# Patient Record
Sex: Female | Born: 1995 | Race: White | Hispanic: No | Marital: Single | Smoking: Former smoker
Health system: Southern US, Community
[De-identification: ages and names within clinical notes are randomized; demographics above are authoritative.]

## PROBLEM LIST (undated history)

## (undated) ENCOUNTER — Inpatient Hospital Stay: Payer: Self-pay

## (undated) DIAGNOSIS — K35209 Acute appendicitis with generalized peritonitis, without abscess, unspecified as to perforation: Secondary | ICD-10-CM

## (undated) DIAGNOSIS — F419 Anxiety disorder, unspecified: Secondary | ICD-10-CM

## (undated) DIAGNOSIS — K37 Unspecified appendicitis: Secondary | ICD-10-CM

## (undated) DIAGNOSIS — E162 Hypoglycemia, unspecified: Secondary | ICD-10-CM

## (undated) DIAGNOSIS — Q513 Bicornate uterus: Secondary | ICD-10-CM

## (undated) DIAGNOSIS — K219 Gastro-esophageal reflux disease without esophagitis: Secondary | ICD-10-CM

## (undated) DIAGNOSIS — K352 Acute appendicitis with generalized peritonitis, without abscess: Secondary | ICD-10-CM

## (undated) HISTORY — DX: Acute appendicitis with generalized peritonitis, without abscess, unspecified as to perforation: K35.209

## (undated) HISTORY — PX: APPENDECTOMY: SHX54

## (undated) HISTORY — DX: Acute appendicitis with generalized peritonitis, without abscess: K35.20

## (undated) HISTORY — DX: Unspecified appendicitis: K37

---
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
## 2016-07-06 ENCOUNTER — Emergency Department
Admission: EM | Admit: 2016-07-06 | Discharge: 2016-07-06 | Payer: No Typology Code available for payment source | Attending: Emergency Medicine | Admitting: Emergency Medicine

## 2016-07-06 ENCOUNTER — Encounter: Payer: Self-pay | Admitting: Emergency Medicine

## 2016-07-06 DIAGNOSIS — Y998 Other external cause status: Secondary | ICD-10-CM | POA: Insufficient documentation

## 2016-07-06 DIAGNOSIS — Y92410 Unspecified street and highway as the place of occurrence of the external cause: Secondary | ICD-10-CM | POA: Insufficient documentation

## 2016-07-06 DIAGNOSIS — Z008 Encounter for other general examination: Secondary | ICD-10-CM | POA: Insufficient documentation

## 2016-07-06 DIAGNOSIS — T07XXXA Unspecified multiple injuries, initial encounter: Secondary | ICD-10-CM

## 2016-07-06 DIAGNOSIS — Y9389 Activity, other specified: Secondary | ICD-10-CM | POA: Insufficient documentation

## 2016-07-06 DIAGNOSIS — T148XXA Other injury of unspecified body region, initial encounter: Secondary | ICD-10-CM | POA: Diagnosis present
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
## 2016-07-06 NOTE — Discharge Instructions (Addendum)
Watch scratches for any signs of infection. Clean daily with mild soap and water. Take Tylenol or ibuprofen as needed for pain. Follow-up with your primary care doctor if any continued problems.
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
## 2016-07-06 NOTE — ED Notes (Signed)
Pt alert and oriented X4, active, cooperative, pt in NAD. RR even and unlabored, color WNL.  Pt left with [employer] Officer, [place].

## 2016-09-04 ENCOUNTER — Emergency Department: Payer: Medicaid Other | Admitting: Registered Nurse

## 2016-09-04 ENCOUNTER — Emergency Department: Payer: Medicaid Other

## 2016-09-04 ENCOUNTER — Encounter: Payer: Self-pay | Admitting: Emergency Medicine

## 2016-09-04 ENCOUNTER — Encounter
Admission: EM | Disposition: A | Discharge status: Home / Self Care | Payer: Self-pay | Source: Home / Self Care | Attending: Emergency Medicine

## 2016-09-04 ENCOUNTER — Observation Stay
Admission: EM | Admit: 2016-09-04 | Discharge: 2016-09-05 | Disposition: A | Discharge status: Home / Self Care | Payer: Medicaid Other | Attending: General Surgery | Admitting: General Surgery

## 2016-09-04 DIAGNOSIS — K352 Acute appendicitis with generalized peritonitis, without abscess: Secondary | ICD-10-CM

## 2016-09-04 DIAGNOSIS — F17210 Nicotine dependence, cigarettes, uncomplicated: Secondary | ICD-10-CM | POA: Insufficient documentation

## 2016-09-04 DIAGNOSIS — K37 Unspecified appendicitis: Secondary | ICD-10-CM | POA: Diagnosis present

## 2016-09-04 HISTORY — PX: LAPAROSCOPIC APPENDECTOMY: SHX408

## 2016-09-04 HISTORY — DX: Unspecified appendicitis: K37

## 2016-09-04 LAB — URINE DRUG SCREEN, QUALITATIVE (ARMC ONLY)
AMPHETAMINES, UR SCREEN: NOT DETECTED
Barbiturates, Ur Screen: NOT DETECTED
Benzodiazepine, Ur Scrn: NOT DETECTED
COCAINE METABOLITE, UR ~~LOC~~: NOT DETECTED
Cannabinoid 50 Ng, Ur ~~LOC~~: POSITIVE — AB
MDMA (ECSTASY) UR SCREEN: NOT DETECTED
METHADONE SCREEN, URINE: NOT DETECTED
OPIATE, UR SCREEN: NOT DETECTED
Phencyclidine (PCP) Ur S: NOT DETECTED
Tricyclic, Ur Screen: NOT DETECTED

## 2016-09-04 LAB — COMPREHENSIVE METABOLIC PANEL
ALBUMIN: 4.9 g/dL (ref 3.5–5.0)
ALT: 29 U/L (ref 14–54)
AST: 27 U/L (ref 15–41)
Alkaline Phosphatase: 54 U/L (ref 38–126)
Anion gap: 11 (ref 5–15)
BUN: 14 mg/dL (ref 6–20)
CHLORIDE: 103 mmol/L (ref 101–111)
CO2: 24 mmol/L (ref 22–32)
Calcium: 9.6 mg/dL (ref 8.9–10.3)
Creatinine, Ser: 0.66 mg/dL (ref 0.44–1.00)
GFR calc Af Amer: >60 mL/min (ref >60)
GFR calc non Af Amer: >60 mL/min (ref >60)
GLUCOSE: 136 mg/dL — AB (ref 65–99)
POTASSIUM: 3.5 mmol/L (ref 3.5–5.1)
SODIUM: 138 mmol/L (ref 135–145)
Total Bilirubin: 0.9 mg/dL (ref 0.3–1.2)
Total Protein: 7.9 g/dL (ref 6.5–8.1)

## 2016-09-04 LAB — URINALYSIS, COMPLETE (UACMP) WITH MICROSCOPIC
BACTERIA UA: NONE SEEN
Bilirubin Urine: NEGATIVE
Glucose, UA: NEGATIVE mg/dL
KETONES UR: 20 mg/dL — AB
LEUKOCYTES UA: NEGATIVE
Nitrite: NEGATIVE
PROTEIN: 30 mg/dL — AB
RBC / HPF: NONE SEEN RBC/hpf (ref 0–5)
Specific Gravity, Urine: 1.023 (ref 1.005–1.030)
WBC UA: NONE SEEN WBC/hpf (ref 0–5)
pH: 8 (ref 5.0–8.0)

## 2016-09-04 LAB — CBC
HEMATOCRIT: 41 % (ref 35.0–47.0)
HEMOGLOBIN: 14.3 g/dL (ref 12.0–16.0)
MCH: 29 pg (ref 26.0–34.0)
MCHC: 34.9 g/dL (ref 32.0–36.0)
MCV: 83.1 fL (ref 80.0–100.0)
Platelets: 346 10*3/uL (ref 150–440)
RBC: 4.93 MIL/uL (ref 3.80–5.20)
RDW: 13 % (ref 11.5–14.5)
WBC: 16.2 10*3/uL — AB (ref 3.6–11.0)

## 2016-09-04 LAB — LIPASE, BLOOD: LIPASE: 28 U/L (ref 11–51)

## 2016-09-04 LAB — POCT PREGNANCY, URINE: PREG TEST UR: NEGATIVE

## 2016-09-04 SURGERY — APPENDECTOMY, LAPAROSCOPIC
Anesthesia: General | Wound class: Clean Contaminated

## 2016-09-04 MED ORDER — FENTANYL CITRATE (PF) 100 MCG/2ML IJ SOLN
INTRAMUSCULAR | Status: DC | PRN
  Administered 2016-09-04: 150 ug via INTRAVENOUS
  Administered 2016-09-04: 50 ug via INTRAVENOUS

## 2016-09-04 MED ORDER — SUGAMMADEX SODIUM 200 MG/2ML IV SOLN
INTRAVENOUS | Status: DC | PRN
  Administered 2016-09-04: 110 mg via INTRAVENOUS

## 2016-09-04 MED ORDER — BUPIVACAINE-EPINEPHRINE (PF) 0.25% -1:200000 IJ SOLN
INTRAMUSCULAR | Status: DC | PRN
  Administered 2016-09-04: 10 mL

## 2016-09-04 MED ORDER — SUGAMMADEX SODIUM 200 MG/2ML IV SOLN
INTRAVENOUS | Status: AC
  Filled 2016-09-04: qty 2

## 2016-09-04 MED ORDER — FENTANYL CITRATE (PF) 100 MCG/2ML IJ SOLN
INTRAMUSCULAR | Status: AC
  Filled 2016-09-04: qty 4

## 2016-09-04 MED ORDER — NICOTINE 21 MG/24HR TD PT24: 21.0000 mg | MEDICATED_PATCH | Freq: Every day | TRANSDERMAL | Status: DC

## 2016-09-04 MED ORDER — BUPIVACAINE HCL (PF) 0.25 % IJ SOLN
INTRAMUSCULAR | Status: AC
  Filled 2016-09-04: qty 30

## 2016-09-04 MED ORDER — ROCURONIUM BROMIDE 100 MG/10ML IV SOLN
INTRAVENOUS | Status: DC | PRN
  Administered 2016-09-04: 10 mg via INTRAVENOUS
  Administered 2016-09-04: 40 mg via INTRAVENOUS

## 2016-09-04 MED ORDER — LIDOCAINE HCL (PF) 2 % IJ SOLN
INTRAMUSCULAR | Status: AC
  Filled 2016-09-04: qty 2

## 2016-09-04 MED ORDER — DEXAMETHASONE SODIUM PHOSPHATE 10 MG/ML IJ SOLN
INTRAMUSCULAR | Status: DC | PRN
  Administered 2016-09-04: 4 mg via INTRAVENOUS

## 2016-09-04 MED ORDER — ONDANSETRON 4 MG PO TBDP
4.0000 mg | ORAL_TABLET | Freq: Four times a day (QID) | ORAL | Status: DC | PRN
  Filled 2016-09-04: qty 1

## 2016-09-04 MED ORDER — DEXAMETHASONE SODIUM PHOSPHATE 10 MG/ML IJ SOLN
INTRAMUSCULAR | Status: AC
  Filled 2016-09-04: qty 1

## 2016-09-04 MED ORDER — LIDOCAINE HCL (CARDIAC) 20 MG/ML IV SOLN
INTRAVENOUS | Status: DC | PRN
  Administered 2016-09-04: 50 mg via INTRAVENOUS

## 2016-09-04 MED ORDER — ACETAMINOPHEN 10 MG/ML IV SOLN
INTRAVENOUS | Status: AC
  Filled 2016-09-04: qty 100

## 2016-09-04 MED ORDER — LACTATED RINGERS IV SOLN
INTRAVENOUS | Status: AC
  Administered 2016-09-04: 13:00:00 via INTRAVENOUS

## 2016-09-04 MED ORDER — HYDROCODONE-ACETAMINOPHEN 5-325 MG PO TABS
1.0000 | ORAL_TABLET | ORAL | Status: DC | PRN
  Administered 2016-09-04 (×3): 1 via ORAL
  Filled 2016-09-04 (×3): qty 1

## 2016-09-04 MED ORDER — MORPHINE SULFATE (PF) 4 MG/ML IV SOLN
4.0000 mg | Freq: Once | INTRAVENOUS | Status: AC
  Administered 2016-09-04: 4 mg via INTRAVENOUS

## 2016-09-04 MED ORDER — METRONIDAZOLE IN NACL 5-0.79 MG/ML-% IV SOLN
500.0000 mg | Freq: Once | INTRAVENOUS | Status: AC
  Administered 2016-09-04: 500 mg via INTRAVENOUS
  Filled 2016-09-04: qty 100

## 2016-09-04 MED ORDER — PROPOFOL 10 MG/ML IV BOLUS
INTRAVENOUS | Status: AC
  Filled 2016-09-04: qty 20

## 2016-09-04 MED ORDER — MORPHINE SULFATE (PF) 2 MG/ML IV SOLN
2.0000 mg | INTRAVENOUS | Status: DC | PRN
  Administered 2016-09-05 (×2): 2 mg via INTRAVENOUS
  Filled 2016-09-04 (×2): qty 1

## 2016-09-04 MED ORDER — ONDANSETRON HCL 4 MG/2ML IJ SOLN
INTRAMUSCULAR | Status: AC
  Administered 2016-09-04: 4 mg via INTRAVENOUS
  Filled 2016-09-04: qty 2

## 2016-09-04 MED ORDER — ONDANSETRON HCL 4 MG/2ML IJ SOLN
INTRAMUSCULAR | Status: AC
  Filled 2016-09-04: qty 2

## 2016-09-04 MED ORDER — DEXTROSE 5 % IV SOLN
1.0000 g | Freq: Once | INTRAVENOUS | Status: AC
  Administered 2016-09-04: 1 g via INTRAVENOUS
  Filled 2016-09-04: qty 10

## 2016-09-04 MED ORDER — FAMOTIDINE 20 MG PO TABS
ORAL_TABLET | ORAL | Status: AC
  Filled 2016-09-04: qty 1

## 2016-09-04 MED ORDER — ONDANSETRON HCL 4 MG/2ML IJ SOLN
4.0000 mg | Freq: Once | INTRAMUSCULAR | Status: AC | PRN
  Administered 2016-09-04: 4 mg via INTRAVENOUS

## 2016-09-04 MED ORDER — SUCCINYLCHOLINE CHLORIDE 20 MG/ML IJ SOLN
INTRAMUSCULAR | Status: AC
  Filled 2016-09-04: qty 1

## 2016-09-04 MED ORDER — SODIUM CHLORIDE 0.9 % IV BOLUS (SEPSIS)
1000.0000 mL | Freq: Once | INTRAVENOUS | Status: AC
  Administered 2016-09-04: 1000 mL via INTRAVENOUS

## 2016-09-04 MED ORDER — ONDANSETRON HCL 4 MG/2ML IJ SOLN
4.0000 mg | Freq: Once | INTRAMUSCULAR | Status: AC
  Administered 2016-09-04: 4 mg via INTRAVENOUS

## 2016-09-04 MED ORDER — PROPOFOL 10 MG/ML IV BOLUS
INTRAVENOUS | Status: DC | PRN
  Administered 2016-09-04: 120 mg via INTRAVENOUS

## 2016-09-04 MED ORDER — DIPHENHYDRAMINE HCL 50 MG/ML IJ SOLN: 25.0000 mg | Freq: Four times a day (QID) | INTRAMUSCULAR | Status: DC | PRN

## 2016-09-04 MED ORDER — LIDOCAINE HCL 1 % IJ SOLN
INTRAMUSCULAR | Status: DC | PRN
  Administered 2016-09-04: 10 mL

## 2016-09-04 MED ORDER — ACETAMINOPHEN 10 MG/ML IV SOLN
INTRAVENOUS | Status: DC | PRN
  Administered 2016-09-04: 1000 mg via INTRAVENOUS

## 2016-09-04 MED ORDER — PHENYLEPHRINE HCL 10 MG/ML IJ SOLN
INTRAMUSCULAR | Status: DC | PRN
  Administered 2016-09-04: 50 ug via INTRAVENOUS

## 2016-09-04 MED ORDER — ONDANSETRON HCL 4 MG/2ML IJ SOLN
INTRAMUSCULAR | Status: DC | PRN
  Administered 2016-09-04: 4 mg via INTRAVENOUS

## 2016-09-04 MED ORDER — SUCCINYLCHOLINE CHLORIDE 20 MG/ML IJ SOLN
INTRAMUSCULAR | Status: DC | PRN
  Administered 2016-09-04: 60 mg via INTRAVENOUS

## 2016-09-04 MED ORDER — METRONIDAZOLE IN NACL 5-0.79 MG/ML-% IV SOLN
500.0000 mg | Freq: Three times a day (TID) | INTRAVENOUS | Status: DC
  Administered 2016-09-04 – 2016-09-05 (×3): 500 mg via INTRAVENOUS
  Filled 2016-09-04 (×5): qty 100

## 2016-09-04 MED ORDER — ONDANSETRON HCL 4 MG/2ML IJ SOLN
4.0000 mg | Freq: Four times a day (QID) | INTRAMUSCULAR | Status: DC | PRN
  Administered 2016-09-04 – 2016-09-05 (×2): 4 mg via INTRAVENOUS
  Filled 2016-09-04 (×2): qty 2

## 2016-09-04 MED ORDER — IOPAMIDOL (ISOVUE-300) INJECTION 61%
100.0000 mL | Freq: Once | INTRAVENOUS | Status: AC | PRN
  Administered 2016-09-04: 100 mL via INTRAVENOUS

## 2016-09-04 MED ORDER — KETOROLAC TROMETHAMINE 30 MG/ML IJ SOLN
INTRAMUSCULAR | Status: AC
  Filled 2016-09-04: qty 1

## 2016-09-04 MED ORDER — DEXTROSE 5 % IV SOLN
2.0000 g | INTRAVENOUS | Status: DC
  Administered 2016-09-05: 2 g via INTRAVENOUS
  Filled 2016-09-04: qty 2

## 2016-09-04 MED ORDER — PHENYLEPHRINE HCL 10 MG/ML IJ SOLN
INTRAMUSCULAR | Status: AC
  Filled 2016-09-04: qty 1

## 2016-09-04 MED ORDER — FAMOTIDINE 20 MG PO TABS
20.0000 mg | ORAL_TABLET | Freq: Once | ORAL | Status: AC
  Administered 2016-09-04: 20 mg via ORAL

## 2016-09-04 MED ORDER — KETOROLAC TROMETHAMINE 30 MG/ML IJ SOLN
INTRAMUSCULAR | Status: DC | PRN
  Administered 2016-09-04: 30 mg via INTRAVENOUS

## 2016-09-04 MED ORDER — LIDOCAINE HCL (PF) 1 % IJ SOLN
INTRAMUSCULAR | Status: AC
  Filled 2016-09-04: qty 30

## 2016-09-04 MED ORDER — DIPHENHYDRAMINE HCL 25 MG PO CAPS
25.0000 mg | ORAL_CAPSULE | Freq: Four times a day (QID) | ORAL | Status: DC | PRN
  Administered 2016-09-04: 25 mg via ORAL
  Filled 2016-09-04: qty 1

## 2016-09-04 MED ORDER — FENTANYL CITRATE (PF) 100 MCG/2ML IJ SOLN
INTRAMUSCULAR | Status: AC
  Filled 2016-09-04: qty 2

## 2016-09-04 MED ORDER — ONDANSETRON 4 MG PO TBDP
4.0000 mg | ORAL_TABLET | Freq: Once | ORAL | Status: AC | PRN
  Administered 2016-09-04: 4 mg via ORAL
  Filled 2016-09-04: qty 1

## 2016-09-04 MED ORDER — MORPHINE SULFATE (PF) 4 MG/ML IV SOLN
INTRAVENOUS | Status: AC
  Filled 2016-09-04: qty 1

## 2016-09-04 MED ORDER — LACTATED RINGERS IV SOLN
INTRAVENOUS | Status: DC
  Administered 2016-09-04: 10:00:00 via INTRAVENOUS

## 2016-09-04 MED ORDER — MIDAZOLAM HCL 2 MG/2ML IJ SOLN
INTRAMUSCULAR | Status: AC
  Filled 2016-09-04: qty 2

## 2016-09-04 MED ORDER — EPINEPHRINE PF 1 MG/ML IJ SOLN
INTRAMUSCULAR | Status: AC
  Filled 2016-09-04: qty 1

## 2016-09-04 MED ORDER — FENTANYL CITRATE (PF) 100 MCG/2ML IJ SOLN
25.0000 ug | INTRAMUSCULAR | Status: DC | PRN
  Administered 2016-09-04 (×2): 25 ug via INTRAVENOUS

## 2016-09-04 MED ORDER — MIDAZOLAM HCL 2 MG/2ML IJ SOLN
INTRAMUSCULAR | Status: DC | PRN
  Administered 2016-09-04: 2 mg via INTRAVENOUS

## 2016-09-04 SURGICAL SUPPLY — 47 items
ADHESIVE MASTISOL STRL (MISCELLANEOUS) ×3 IMPLANT
APPLIER CLIP 5 13 M/L LIGAMAX5 (MISCELLANEOUS)
BLADE SURG SZ11 CARB STEEL (BLADE) ×3 IMPLANT
BULB RESERV EVAC DRAIN JP 100C (MISCELLANEOUS) IMPLANT
CANISTER SUCT 1200ML W/VALVE (MISCELLANEOUS) ×3 IMPLANT
CATH TRAY 16F METER LATEX (MISCELLANEOUS) ×3 IMPLANT
CHLORAPREP W/TINT 26ML (MISCELLANEOUS) ×3 IMPLANT
CLIP APPLIE 5 13 M/L LIGAMAX5 (MISCELLANEOUS) IMPLANT
CLOSURE WOUND 1/2 X4 (GAUZE/BANDAGES/DRESSINGS) ×1
CUTTER FLEX LINEAR 45M (STAPLE) ×3 IMPLANT
DRAIN CHANNEL JP 19F (MISCELLANEOUS) IMPLANT
DRSG TEGADERM 2-3/8X2-3/4 SM (GAUZE/BANDAGES/DRESSINGS) ×9 IMPLANT
DRSG TELFA 4X3 1S NADH ST (GAUZE/BANDAGES/DRESSINGS) ×3 IMPLANT
ELECT REM PT RETURN 9FT ADLT (ELECTROSURGICAL) ×3
ELECTRODE REM PT RTRN 9FT ADLT (ELECTROSURGICAL) ×1 IMPLANT
GLOVE BIO SURGEON STRL SZ7.5 (GLOVE) ×6 IMPLANT
GLOVE INDICATOR 8.0 STRL GRN (GLOVE) ×3 IMPLANT
GOWN STRL REUS W/ TWL LRG LVL3 (GOWN DISPOSABLE) ×2 IMPLANT
GOWN STRL REUS W/TWL LRG LVL3 (GOWN DISPOSABLE) ×4
IRRIGATION STRYKERFLOW (MISCELLANEOUS) ×1 IMPLANT
IRRIGATOR STRYKERFLOW (MISCELLANEOUS) ×3
IV NS 1000ML (IV SOLUTION) ×2
IV NS 1000ML BAXH (IV SOLUTION) ×1 IMPLANT
KIT RM TURNOVER STRD PROC AR (KITS) ×3 IMPLANT
LABEL OR SOLS (LABEL) ×3 IMPLANT
NDL SAFETY ECLIPSE 18X1.5 (NEEDLE) ×1 IMPLANT
NEEDLE HYPO 18GX1.5 SHARP (NEEDLE) ×2
NEEDLE HYPO 25X1 1.5 SAFETY (NEEDLE) ×3 IMPLANT
NEEDLE VERESS 14GA 120MM (NEEDLE) ×3 IMPLANT
NS IRRIG 500ML POUR BTL (IV SOLUTION) ×3 IMPLANT
PACK LAP CHOLECYSTECTOMY (MISCELLANEOUS) ×3 IMPLANT
POUCH SPECIMEN RETRIEVAL 10MM (ENDOMECHANICALS) ×3 IMPLANT
RELOAD 45 VASCULAR/THIN (ENDOMECHANICALS) ×3 IMPLANT
RELOAD STAPLE TA45 3.5 REG BLU (ENDOMECHANICALS) ×3 IMPLANT
SCALPEL HARMONIC ACE (MISCELLANEOUS) ×3 IMPLANT
SHEARS HARMONIC ACE PLUS 36CM (ENDOMECHANICALS) ×3 IMPLANT
SLEEVE ENDOPATH XCEL 5M (ENDOMECHANICALS) ×3 IMPLANT
STRIP CLOSURE SKIN 1/2X4 (GAUZE/BANDAGES/DRESSINGS) ×2 IMPLANT
SUT MNCRL 4-0 (SUTURE) ×2
SUT MNCRL 4-0 27XMFL (SUTURE) ×1
SUT VICRYL 0 AB UR-6 (SUTURE) ×3 IMPLANT
SUTURE MNCRL 4-0 27XMF (SUTURE) ×1 IMPLANT
SYR 10ML LL (SYRINGE) ×3 IMPLANT
SYR TB 1ML 27GX1/2 LL (SYRINGE) ×3 IMPLANT
TROCAR XCEL 12X100 BLDLESS (ENDOMECHANICALS) ×3 IMPLANT
TROCAR XCEL NON-BLD 5MMX100MML (ENDOMECHANICALS) ×3 IMPLANT
TUBING INSUFFLATOR HI FLOW (MISCELLANEOUS) ×3 IMPLANT

## 2016-09-04 NOTE — ED Notes (Signed)
Patient transported to CT 

## 2016-09-04 NOTE — Op Note (Signed)
laparascopic appendectomy   Stacey Donovan Date of operation:  09/04/2016  Indications: The patient presented with a history of  abdominal pain. Workup has revealed findings consistent with acute appendicitis.  Pre-operative Diagnosis: Acute appendicitis with generalized peritonitis  Post-operative Diagnosis: Same  Surgeon: Leonette Mostharles T. Tonita CongWoodham, MD, FACS  Anesthesia: General with endotracheal tube  Procedure Details  The patient was seen again in the preop area. The options of surgery versus observation were reviewed with the patient and/or family. The risks of bleeding, infection, recurrence of symptoms, negative laparoscopy, potential for an open procedure, bowel injury, abscess or infection, were all reviewed as well. The patient was taken to Operating Room, identified as Stacey BadBriana A Keay and the procedure verified as laparoscopic appendectomy. A Time Out was held and the above information confirmed.  The patient was placed in the supine position and general anesthesia was induced.  Antibiotic prophylaxis was administered and VT E prophylaxis was in place. A Foley catheter was placed by the nursing staff.   The abdomen was prepped and draped in a sterile fashion. An infraumbilical incision was made. A Veress needle was placed and pneumoperitoneum was obtained. A 5 mm trocar port was placed without difficulty and the abdominal cavity was explored.  Under direct vision a 5 mm suprapubic port was placed and a 12 mm left lateral port was placed all under direct vision.  The appendix was identified and found to be acutely inflamed lateral to the colon without any omental attachments.   The appendix was carefully dissected. The base of the appendix was dissected out and divided with a standard load Endo GIA. The mesoappendix was divided with use of the Harmonic scalpel. The appendix was passed out through the left lateral port site with the aid of an Endo Catch bag. The right lower quadrant  and pelvis was then irrigated with copious amounts of normal saline which was aspirated. Inspection  failed to identify any additional bleeding and there were no signs of bowel injury.   The  pneumoperitoneum was released,  all ports were removed and the left lower quadrant 12 mm port fascia was closed with a figure-of-eight 0 Vicryl andthe skin incisions were approximated with subcuticular 4-0 Monocryl. Steri-Strips and Mastisol and sterile dressings were placed.  The patient tolerated the procedure well, there were no complications. The sponge lap and needle count were correct at the end of the procedure.  The patient was taken to the recovery room in stable condition to be admitted for continued care.  Findings: Acute appendicitis  Estimated Blood Loss: 5 mL                  Specimens: appendix         Complications:  None                  Ricarda Frameharles Ajay Strubel MD, FACS

## 2016-09-04 NOTE — Brief Op Note (Signed)
09/04/2016  10:43 AM  PATIENT:  Stacey Donovan  21 y.o. female  PRE-OPERATIVE DIAGNOSIS:  appendicitis  POST-OPERATIVE DIAGNOSIS:  appendicitis  PROCEDURE:  Procedure(s): APPENDECTOMY LAPAROSCOPIC (N/A)  SURGEON:  Surgeon(s) and Role:    * Ricarda FrameWoodham, Laddie Math, MD - Primary  PHYSICIAN ASSISTANT:   ASSISTANTS: none   ANESTHESIA:   general  EBL:  Total I/O In: 400 [I.V.:400] Out: 60 [Urine:50; Blood:10]  BLOOD ADMINISTERED:none  DRAINS: none   LOCAL MEDICATIONS USED:  MARCAINE   , XYLOCAINE  and Amount: 20 ml  SPECIMEN:  Source of Specimen:  appendix  DISPOSITION OF SPECIMEN:  PATHOLOGY  COUNTS:  YES  TOURNIQUET:  * No tourniquets in log *  DICTATION: .Dragon Dictation  PLAN OF CARE: Admit for overnight observation  PATIENT DISPOSITION:  PACU - hemodynamically stable.   Delay start of Pharmacological VTE agent (>24hrs) due to surgical blood loss or risk of bleeding: no

## 2016-09-04 NOTE — Anesthesia Post-op Follow-up Note (Cosign Needed)
Anesthesia QCDR form completed.        

## 2016-09-04 NOTE — Anesthesia Procedure Notes (Signed)
Procedure Name: Intubation Performed by: Lance Muss Pre-anesthesia Checklist: Patient identified, Patient being monitored, Timeout performed, Emergency Drugs available and Suction available Patient Re-evaluated:Patient Re-evaluated prior to induction Oxygen Delivery Method: Circle system utilized Preoxygenation: Pre-oxygenation with 100% oxygen Induction Type: IV induction, Rapid sequence and Cricoid Pressure applied Laryngoscope Size: Mac and 3 Grade View: Grade I Tube type: Oral Tube size: 7.0 mm Number of attempts: 1 Airway Equipment and Method: Stylet Placement Confirmation: ETT inserted through vocal cords under direct vision,  positive ETCO2 and breath sounds checked- equal and bilateral Secured at: 22 cm Tube secured with: Tape Dental Injury: Teeth and Oropharynx as per pre-operative assessment

## 2016-09-04 NOTE — ED Triage Notes (Addendum)
Pt states pain started at 1am this morning with generalized abd pain and then started vomiting at 0130. Patient states she feels dizzy and going to pass out. Patient asked if she could lay on floor and nurse states that she could lay back in the chair. Patient states that she is dyhydrataed and she is 10/10 pain. Patient is dry heaving in triage room and given ODT zofran.

## 2016-09-04 NOTE — ED Provider Notes (Signed)
[facility] Emergency Department Provider Note   ____________________________________________   First MD Initiated Contact with Patient 09/04/16 [phone]     (approximate)  I have reviewed the triage vital signs and the nursing notes.   HISTORY  Chief Complaint Abdominal Pain    HPI Stacey Donovan is a 21 y.o. female who comes into the hospital today with some abdominal pain and vomiting. She states that she's been vomiting since 1:30 and she cannot quantify how much. She reports that the pain is all over her abdomen. She has not had any diarrhea but feels as though she may. The patient denies any sick contacts and reports the emesis looked yellow. It was initially food and then yellow. The patient denies any fever recently and a 9 out of 10 in intensity currently. She denies any pain with urination. She is here today for evaluation. She did not take any medication and she cannot keep anything down.   History reviewed. No pertinent past medical history.  There are no active problems to display for this patient.   History reviewed. No pertinent surgical history.  Prior to Admission medications   Not on File    Allergies Patient has no known allergies.  No family history on file.  Social History Social History  Substance Use Topics  . Smoking status: Current Every Day Smoker    Packs/day: 1.00    Types: Cigarettes  . Smokeless tobacco: Never Used  . Alcohol use No    Review of Systems  Constitutional: No fever/chills Eyes: No visual changes. ENT: No sore throat. Cardiovascular: Denies chest pain. Respiratory: Denies shortness of breath. Gastrointestinal:  abdominal pain.   nausea,  vomiting.  No diarrhea.  No constipation. Genitourinary: Negative for dysuria. Musculoskeletal: Negative for back pain. Skin: Negative for rash. Neurological: Negative for headaches, focal weakness or  numbness.   ____________________________________________   PHYSICAL EXAM:  VITAL SIGNS: ED Triage Vitals  Enc Vitals Group     BP 09/04/16 0351 (!) 104/58     Pulse Rate 09/04/16 0351 73     Resp 09/04/16 0351 18     Temp 09/04/16 0351 98.1 F (36.7 C)     Temp Source 09/04/16 0351 Axillary     SpO2 09/04/16 0351 100 %     Weight 09/04/16 0352 115 lb (52.2 kg)     Height 09/04/16 0352 5\' 3"  (1.6 m)     Head Circumference --      Peak Flow --      Pain Score 09/04/16 0350 10     Pain Loc --      Pain Edu? --      Excl. in GC? --     Constitutional: Alert and oriented. Well appearing and in moderate distress. Eyes: Conjunctivae are normal. PERRL. EOMI. Head: Atraumatic. Nose: No congestion/rhinnorhea. Mouth/Throat: Mucous membranes are moist.  Oropharynx non-erythematous. Cardiovascular: Normal rate, regular rhythm. Grossly normal heart sounds.  Good peripheral circulation. Respiratory: Normal respiratory effort.  No retractions. Lungs CTAB. Gastrointestinal: Soft with diffuse abdominal tenderness to palpation. No distention. Positive bowel sounds Musculoskeletal: No lower extremity tenderness nor edema.   Neurologic:  Normal speech and language.  Skin:  Skin is warm, dry and intact.  Psychiatric: Mood and affect are normal.   ____________________________________________   LABS (all labs ordered are listed, but only abnormal results are displayed)  Labs Reviewed  COMPREHENSIVE METABOLIC PANEL - Abnormal; Notable for the following:       Result Value  Glucose, Bld 136 (*)    All other components within normal limits  CBC - Abnormal; Notable for the following:    WBC 16.2 (*)    All other components within normal limits  URINALYSIS, COMPLETE (UACMP) WITH MICROSCOPIC - Abnormal; Notable for the following:    Color, Urine YELLOW (*)    APPearance TURBID (*)    Hgb urine dipstick SMALL (*)    Ketones, ur 20 (*)    Protein, ur 30 (*)    Squamous Epithelial / LPF  0-5 (*)    All other components within normal limits  LIPASE, BLOOD  POC URINE PREG, ED  POCT PREGNANCY, URINE   ____________________________________________  EKG  none ____________________________________________  RADIOLOGY  Ct Abdomen Pelvis W Contrast  Result Date: 09/04/2016 CLINICAL DATA:  Abdominal pain and vomiting. EXAM: CT ABDOMEN AND PELVIS WITH CONTRAST TECHNIQUE: Multidetector CT imaging of the abdomen and pelvis was performed using the standard protocol following bolus administration of intravenous contrast. CONTRAST:  ISOVUE-300 IOPAMIDOL (ISOVUE-300) INJECTION 61% COMPARISON:  Abdominal x-ray dated July 12, 2015. FINDINGS: Lower chest: The lung bases are clear. The heart is normal in size. No pericardial effusion. Hepatobiliary: No focal liver abnormality is seen. No gallstones, gallbladder wall thickening, or biliary dilatation. Pancreas: Unremarkable. No pancreatic ductal dilatation or surrounding inflammatory changes. Spleen: Normal in size without focal abnormality. Adrenals/Urinary Tract: Adrenal glands are unremarkable. Kidneys are normal, without renal calculi, focal lesion, or hydronephrosis. Bladder is under distended. Stomach/Bowel: Stomach is within normal limits. Appendix is dilated, measuring up to 11 mm, and contains an appendicolith. There is mild surrounding periappendiceal inflammation. No evidence of obstruction. Vascular/Lymphatic: No significant vascular findings are present. No enlarged abdominal or pelvic lymph nodes. Reproductive: Uterus and bilateral adnexa are unremarkable. Other: Trace free fluid in the pelvis. Musculoskeletal: No acute or significant osseous findings. IMPRESSION: 1. Acute uncomplicated appendicitis. No evidence of perforation or abscess. These results will be called to the ordering clinician or representative by the Radiologist Assistant, and communication documented in the PACS or zVision Dashboard. Electronically Signed   By: Obie Dredge M.D.   On: 09/04/2016 07:20    ____________________________________________   PROCEDURES  Procedure(s) performed: None  Procedures  Critical Care performed: No  ____________________________________________   INITIAL IMPRESSION / ASSESSMENT AND PLAN / ED COURSE  Pertinent labs & imaging results that were available during my care of the patient were reviewed by me and considered in my medical decision making (see chart for details).  This is a 21 year old female who comes into the hospital today with abdominal pain and vomiting. Given the patient's diffuse pain and my inability to palpate her abdomen I decided to send the patient for a CT scan. The patient does have a white blood cell count of 16,000. The patient's CT scan came back showing acute appendicitis. I did contact Dr. Tonita Cong, the surgeon on-call and he will come in to see the patient. She did receive a dose of morphine and Zofran as well as liter of normal saline. She will be admitted to the surgical service for acute appendicitis.      ____________________________________________   FINAL CLINICAL IMPRESSION(S) / ED DIAGNOSES  Final diagnoses:  Acute appendicitis with generalized peritonitis      NEW MEDICATIONS STARTED DURING THIS VISIT:  New Prescriptions   No medications on file     Note:  This document was prepared using Dragon voice recognition software and may include unintentional dictation errors.    Lucrezia Europe  P, MD 09/04/16 [id number]

## 2016-09-04 NOTE — H&P (Signed)
Patient ID: Stacey Donovan, female   DOB: 1995/03/10, 21 y.o.   MRN: [id number]  CC: Abdominal pain  HPI Stacey BadBriana A Renton is a 21 y.o. female who presents to the ER with a 1 day history of abdominal pain. Patient reports the pain awoke her from sleep at 1:00 this morning. Approximately 30 minutes later she then developed nausea and vomiting. She's been unable to keep anything down since the nausea vomiting started. She states she's never had any pain like this before. She denies any recent sick contacts or any recent illness or self. The pain is described as sharp, stabbing, coming and going in waves. Nothing has made it better since it started. She denies any other fevers, chills, chest pain, shortness of breath, diarrhea, constipation. She has had some abdominal cramping but not any bowel movement with.  HPI  History reviewed. No pertinent past medical history. Patient currently on no medications.  History reviewed. No pertinent surgical history. Patient has never had abdominal surgery.  Family history: Remote family history of breast cancer, colon cancer, diabetes. Mother with diabetes. No family history of heart disease known.  Social History Social History  Substance Use Topics  . Smoking status: Current Every Day Smoker    Packs/day: 1.00    Types: Cigarettes  . Smokeless tobacco: Never Used  . Alcohol use No    No Known Allergies  Current Facility-Administered Medications  Medication Dose Route Frequency Provider Last Rate Last Dose  . morphine 4 MG/ML injection           . ondansetron (ZOFRAN) 4 MG/2ML injection           . cefTRIAXone (ROCEPHIN) 1 g in dextrose 5 % 50 mL IVPB  1 g Intravenous Once Rebecka ApleyWebster, Allison P, MD      . metroNIDAZOLE (FLAGYL) IVPB 500 mg  500 mg Intravenous Once Rebecka ApleyWebster, Allison P, MD       No current outpatient prescriptions on file.     Review of Systems A Multi-point review of systems was asked and was negative except for the findings  documented in the history of present illness  Physical Exam Blood pressure 101/75, pulse 84, temperature 98.1 F (36.7 C), temperature source Axillary, resp. rate 18, height 5\' 3"  (1.6 m), weight 52.2 kg (115 lb), last menstrual period 04/07/2016, SpO2 100 %. CONSTITUTIONAL: No acute distress. EYES: Pupils are equal, round, and reactive to light, Sclera are non-icteric. EARS, NOSE, MOUTH AND THROAT: The oropharynx is clear. The oral mucosa is pink and moist. Hearing is intact to voice. LYMPH NODES:  Lymph nodes in the neck are normal. RESPIRATORY:  Lungs are clear. There is normal respiratory effort, with equal breath sounds bilaterally, and without pathologic use of accessory muscles. CARDIOVASCULAR: Heart is regular without murmurs, gallops, or rubs. GI: The abdomen is soft, tender to palpation in the right lower quadrant at McBurney's point but with a negative Rovsing sign, and nondistended. There are no palpable masses. There is no hepatosplenomegaly. There are normal bowel sounds in all quadrants. GU: Rectal deferred.   MUSCULOSKELETAL: Normal muscle strength and tone. No cyanosis or edema.   SKIN: Turgor is good and there are no pathologic skin lesions or ulcers. NEUROLOGIC: Motor and sensation is grossly normal. Cranial nerves are grossly intact. PSYCH:  Oriented to person, place and time. Affect is normal.  Data Reviewed Images and labs reviewed which show a leukocytosis of 16.2 and the remainder of her labs within normal limits. CT scan of the  abdomen shows a dilated thickened appendix consistent with acute appendicitis. There is no evidence of free fluid, free air, abscess. I have personally reviewed the patient's imaging, laboratory findings and medical records.    Assessment    Acute appendicitis    Plan    21 year old female with acute appendicitis. Discussed treatment options of IV antibiotics versus laparoscopic appendectomy. The risks, benefits, alternatives of each were  discussed in detail. After this discussion they elect to proceed with a laparoscopic appendectomy. The procedure was described in detail to include the risk of pain, bleeding, infection, damage to stranding structures, need to convert to an open procedure. The benefit of removing the source of infection and pain as described. The likely recovery and need to stay in hospital overnight were also discussed. Patient and her family voiced understanding and they desire to proceed. We'll proceed urgently to the operating room for laparoscopic appendectomy with plans to stay overnight for 24 hours of antibiotics. All questions were answered to their satisfaction.     Time spent with the patient was 50 minutes, with more than 50% of the time spent in face-to-face education, counseling and care coordination.     Ricarda Frameharles Faruq Rosenberger, MD FACS General Surgeon 09/04/2016, 8:30 AM

## 2016-09-04 NOTE — Transfer of Care (Signed)
Immediate Anesthesia Transfer of Care Note  Patient: Stacey Donovan  Procedure(s) Performed: Procedure(s): APPENDECTOMY LAPAROSCOPIC (N/A)  Patient Location: PACU  Anesthesia Type:General  Level of Consciousness: awake and alert   Airway & Oxygen Therapy: Patient Spontanous Breathing and Patient connected to face mask oxygen  Post-op Assessment: Report given to RN and Post -op Vital signs reviewed and stable  Post vital signs: Reviewed and stable  Last Vitals:  Vitals:   09/04/16 0941 09/04/16 1053  BP: (!) 111/58 137/80  Pulse: 82 87  Resp: 16 10  Temp: 36.7 C     Last Pain:  Vitals:   09/04/16 0941  TempSrc: Tympanic  PainSc: 8          Complications: No apparent anesthesia complications

## 2016-09-04 NOTE — ED Notes (Signed)
MD informed of patient's CT results.

## 2016-09-04 NOTE — ED Notes (Signed)
Signed consent on chart.

## 2016-09-04 NOTE — Anesthesia Preprocedure Evaluation (Addendum)
Anesthesia Evaluation  Patient identified by MRN, date of birth, ID band Patient awake    Reviewed: Allergy & Precautions, NPO status , Patient's Chart, lab work & pertinent test results  Airway Mallampati: III  TM Distance: >3 FB     Dental   Pulmonary Current Smoker,    Pulmonary exam normal        Cardiovascular negative cardio ROS Normal cardiovascular exam     Neuro/Psych negative neurological ROS  negative psych ROS   GI/Hepatic Neg liver ROS, Acute appendecitis   Endo/Other  negative endocrine ROS  Renal/GU negative Renal ROS  negative genitourinary   Musculoskeletal negative musculoskeletal ROS (+)   Abdominal Normal abdominal exam  (+)   Peds negative pediatric ROS (+)  Hematology negative hematology ROS (+)   Anesthesia Other Findings History reviewed. No pertinent past medical history.  Reproductive/Obstetrics                            Anesthesia Physical Anesthesia Plan  ASA: II and emergent  Anesthesia Plan: General   Post-op Pain Management:    Induction: Intravenous, Cricoid pressure planned and Rapid sequence  PONV Risk Score and Plan: 3 and Ondansetron, Dexamethasone, Propofol and Midazolam  Airway Management Planned: Oral ETT  Additional Equipment:   Intra-op Plan:   Post-operative Plan: Extubation in OR  Informed Consent: I have reviewed the patients History and Physical, chart, labs and discussed the procedure including the risks, benefits and alternatives for the proposed anesthesia with the patient or authorized representative who has indicated his/her understanding and acceptance.   Dental advisory given  Plan Discussed with: CRNA and Surgeon  Anesthesia Plan Comments:         Anesthesia Quick Evaluation

## 2016-09-05 LAB — SURGICAL PATHOLOGY

## 2016-09-05 MED ORDER — ONDANSETRON 4 MG PO TBDP: 4.0000 mg | ORAL_TABLET | Freq: Four times a day (QID) | ORAL | 0 refills | Status: DC | PRN

## 2016-09-05 MED ORDER — HYDROCODONE-ACETAMINOPHEN 5-325 MG PO TABS: 1.0000 | ORAL_TABLET | ORAL | 0 refills | Status: DC | PRN

## 2016-09-05 NOTE — Anesthesia Postprocedure Evaluation (Signed)
Anesthesia Post Note  Patient: Stacey Donovan  Procedure(s) Performed: Procedure(s) (LRB): APPENDECTOMY LAPAROSCOPIC (N/A)  Patient location during evaluation: PACU Anesthesia Type: General Level of consciousness: awake and alert and oriented Pain management: pain level controlled Vital Signs Assessment: post-procedure vital signs reviewed and stable Respiratory status: spontaneous breathing Cardiovascular status: blood pressure returned to baseline Anesthetic complications: no     Last Vitals:  Vitals:   09/04/16 2021 09/05/16 0537  BP: 112/68 (!) 93/47  Pulse: (!) 56 (!) 58  Resp: 20 20  Temp: 36.6 C 36.8 C    Last Pain:  Vitals:   09/05/16 0906  TempSrc:   PainSc: 6                  Saiquan Hands

## 2016-09-05 NOTE — Discharge Instructions (Signed)
Laparoscopic Appendectomy, Adult, Care After Refer to this sheet in the next few weeks. These instructions provide you with information about caring for yourself after your procedure. Your health care provider may also give you more specific instructions. Your treatment has been planned according to current medical practices, but problems sometimes occur. Call your health care provider if you have any problems or questions after your procedure. What can I expect after the procedure? After the procedure, it is common to have:  A decrease in your energy level.  Mild pain in the area where the surgical cuts (incisions) were made.  Constipation. This can be caused by pain medicine and a decrease in your activity.  Follow these instructions at home: Medicines  Take over-the-counter and prescription medicines only as told by your health care provider.  Do not drive for 24 hours if you received a sedative.  Do not drive or operate heavy machinery while taking prescription pain medicine.  If you were prescribed an antibiotic medicine, take it as told by your health care provider. Do not stop taking the antibiotic even if you start to feel better. Activity  For 2 weeks or as long as told by your health care provider: ? Do not lift anything that is heavier than 10 pounds (4.5 kg). ? Do not play contact sports.  Gradually return to your normal activities. Ask your health care provider what activities are safe for you. Bathing  Keep your incisions clean and dry. Clean them as often as told by your health care provider: ? Gently wash the incisions with soap and water. ? Rinse the incisions with water to remove all soap. ? Pat the incisions dry with a clean towel. Do not rub the incisions.  You may take showers after 24 hours.  Do not take baths, swim, or use hot tubs for 2 weeks or as told by your health care provider. Incision care  Follow instructions from your healthcare provider about  how to take care of your incisions. Make sure you: ? Wash your hands with soap and water before you change your bandage (dressing). If soap and water are not available, use hand sanitizer. ? Change your dressing as told by your health care provider. Replace top bandages daily until they are clean when removed. ? Leave stitches (sutures), skin glue, or adhesive strips in place. These skin closures may need to stay in place for 2 weeks or longer. If adhesive strip edges start to loosen and curl up, you may trim the loose edges. Do not remove adhesive strips completely unless your health care provider tells you to do that.  Check your incision areas every day for signs of infection. Check for: ? More redness, swelling, or pain. ? More fluid or blood. ? Warmth. ? Pus or a bad smell. Other Instructions  If you were sent home with a drain, follow instructions from your health care provider about how to care for the drain and how to empty it.  Take deep breaths. This helps to prevent your lungs from becoming inflamed.  To relieve and prevent constipation: ? Drink plenty of fluids. ? Eat plenty of fruits and vegetables.  Keep all follow-up visits as told by your health care provider. This is important. Contact a health care provider if:  You have more redness, swelling, or pain around an incision.  You have more fluid or blood coming from an incision.  Your incision feels warm to the touch.  You have pus or a bad  smell coming from an incision or dressing.  Your incision edges break open after your sutures have been removed.  You have increasing pain in your shoulders.  You feel dizzy or you faint.  You develop shortness of breath.  You keep feeling nauseous or vomiting.  You have diarrhea or you cannot control your bowel functions.  You lose your appetite.  You develop swelling or pain in your legs. Get help right away if:  You have a fever.  You develop a rash.  You have  difficulty breathing.  You have sharp pains in your chest. This information is not intended to replace advice given to you by your health care provider. Make sure you discuss any questions you have with your health care provider. Document Released: 01/27/2005 Document Revised: 06/29/2015 Document Reviewed: 07/17/2014 Elsevier Interactive Patient Education  2017 [employer]

## 2016-09-05 NOTE — Discharge Summary (Signed)
Patient ID: Danford BadBriana A Blanck MRN: [id number] DOB/AGE: 1995-10-18 21 y.o.  Admit date: 09/04/2016 Discharge date: 09/05/2016  Discharge Diagnoses:  Appendicitis  Procedures Performed: Laparoscopic Appendectomy  Discharged Condition: good  Hospital Course: Patient taken to OR for appendectomy from the ER. Tolerated the procedure well. Was able to be discharged home the first post operative day. On day of discharge her abdomen was soft, appropriately tender at her incision sites and non-distended.  Discharge Orders: Discharge Instructions    Call MD for:  persistant nausea and vomiting    Complete by:  As directed    Call MD for:  redness, tenderness, or signs of infection (pain, swelling, redness, odor or green/yellow discharge around incision site)    Complete by:  As directed    Call MD for:  severe uncontrolled pain    Complete by:  As directed    Call MD for:  temperature >100.4    Complete by:  As directed    Diet - low sodium heart healthy    Complete by:  As directed    Increase activity slowly    Complete by:  As directed       Disposition: [facility] Not Defined  Discharge Medications: Allergies as of 09/05/2016   No Known Allergies     Medication List    TAKE these medications   HYDROcodone-acetaminophen 5-325 MG tablet Commonly known as:  NORCO/VICODIN Take 1-2 tablets by mouth every 4 (four) hours as needed for moderate pain.   ondansetron 4 MG disintegrating tablet Commonly known as:  ZOFRAN-ODT Take 1 tablet (4 mg total) by mouth every 6 (six) hours as needed for nausea.        Follwup: Follow-up Information    Ricarda FrameWoodham, Chaelyn Bunyan, MD. Schedule an appointment as soon as possible for a visit in 2 week(s).   Specialty:  General Surgery Why:  post op lap appy Contact information: [address] [place] [id number] [phone]           Signed: Ricarda FrameCharles George Alcantar 09/05/2016, 7:55 AM

## 2016-09-05 NOTE — Progress Notes (Signed)
IV was removed. Discharge instructions, follow-up appointments, and prescriptions were provided to the pt. The pt was taken downstairs via wheelchair by volunteer services.  

## 2016-09-18 ENCOUNTER — Encounter: Payer: Self-pay | Admitting: General Surgery

## 2016-09-18 ENCOUNTER — Telehealth: Payer: Self-pay | Admitting: General Practice

## 2016-09-18 NOTE — Telephone Encounter (Signed)
Left message with patients boyfriend to have her call the office back so we can rescheduled her no showed appointment on 09/18/16

## 2017-08-07 ENCOUNTER — Ambulatory Visit (INDEPENDENT_AMBULATORY_CARE_PROVIDER_SITE_OTHER): Payer: Medicaid Other | Admitting: Obstetrics and Gynecology

## 2017-08-07 ENCOUNTER — Encounter: Payer: Self-pay | Admitting: Obstetrics and Gynecology

## 2017-08-07 VITALS — BP 110/58 | HR 73 | Ht 61.0 in | Wt 109.0 lb

## 2017-08-07 DIAGNOSIS — F329 Major depressive disorder, single episode, unspecified: Secondary | ICD-10-CM

## 2017-08-07 DIAGNOSIS — O99341 Other mental disorders complicating pregnancy, first trimester: Secondary | ICD-10-CM

## 2017-08-07 DIAGNOSIS — O219 Vomiting of pregnancy, unspecified: Secondary | ICD-10-CM

## 2017-08-07 MED ORDER — PROMETHAZINE HCL 25 MG PO TABS: 25.0000 mg | ORAL_TABLET | Freq: Four times a day (QID) | ORAL | 2 refills | Status: DC | PRN

## 2017-08-07 MED ORDER — SERTRALINE HCL 50 MG PO TABS: 50.0000 mg | ORAL_TABLET | Freq: Every day | ORAL | 11 refills | Status: DC

## 2017-08-07 MED ORDER — DOXYLAMINE-PYRIDOXINE ER 20-20 MG PO TBCR: 1.0000 | EXTENDED_RELEASE_TABLET | Freq: Two times a day (BID) | ORAL | 11 refills | Status: AC

## 2017-08-07 NOTE — Progress Notes (Signed)
Patient ID: Stacey Donovan, female   DOB: 1995/09/08, 21 y.o.   MRN: [id number]  Reason for Consult: Nausea (Severe Nausea and depression )   Referred by Care, [facility]  Subjective:     HPI:  Stacey Donovan is a 22 y.o. female. She reports extreme nausea and vomiting. She is having 5 or more episodes a day of emesis. She has been able to keep some food and liquid down. She does not feel dehydrated. She is tolerating juicy juice and water. She has gained weight despite this vomiting. She has tried ginger but it has not helped. She also reports that she is having depression and anxiety. She reports that she has a short fuse and will get mad easily at her significant other. She has been on antidepressants in the past and would like to restart one. She does  Not recall what she took before. Edinburgh score today is 13.  She has not had any vaginal bleeding.  She reports marijuana use prior to pregnancy, but says that she has now stopped.  Denies severe abdominal pain.    LMP: Jun 21 2017 (approximately) Hx of chlamydia about 3 years ago Hx of varicella as a child.    Past Medical History:  Diagnosis Date  . Acute appendicitis with generalized peritonitis   . Appendicitis 09/04/2016   History reviewed. No pertinent family history. Past Surgical History:  Procedure Laterality Date  . LAPAROSCOPIC APPENDECTOMY N/A 09/04/2016   Procedure: APPENDECTOMY LAPAROSCOPIC;  Surgeon: Ricarda Frame, MD;  Location: [facility] ORS;  Service: General;  Laterality: N/A;    Short Social History:  Social History   Tobacco Use  . Smoking status: Former Smoker    Packs/day: 1.00    Types: Cigarettes  . Smokeless tobacco: Never Used  Substance Use Topics  . Alcohol use: No    No Known Allergies  Current Outpatient Medications  Medication Sig Dispense Refill  . Doxylamine-Pyridoxine ER (BONJESTA) 20-20 MG TBCR Take 1 tablet by mouth 2 (two) times daily. Sig 1 tab po  daily at bedtime, 1  tab po daily morning 60 tablet 11  . HYDROcodone-acetaminophen (NORCO/VICODIN) 5-325 MG tablet Take 1-2 tablets by mouth every 4 (four) hours as needed for moderate pain. (Patient not taking: Reported on 08/07/2017) 30 tablet 0  . ondansetron (ZOFRAN-ODT) 4 MG disintegrating tablet Take 1 tablet (4 mg total) by mouth every 6 (six) hours as needed for nausea. (Patient not taking: Reported on 08/07/2017) 20 tablet 0  . promethazine (PHENERGAN) 25 MG tablet Take 1 tablet (25 mg total) by mouth every 6 (six) hours as needed for nausea or vomiting. 30 tablet 2  . sertraline (ZOLOFT) 50 MG tablet Take 1 tablet (50 mg total) by mouth daily. 30 tablet 11   No current facility-administered medications for this visit.     Review of Systems  Constitutional: Negative for chills, fatigue, fever and unexpected weight change.  HENT: Negative for trouble swallowing.  Eyes: Negative for loss of vision.  Respiratory: Negative for cough, shortness of breath and wheezing.  Cardiovascular: Negative for chest pain, leg swelling, palpitations and syncope.  GI: Negative for abdominal pain, blood in stool, diarrhea, nausea and vomiting.  GU: Negative for difficulty urinating, dysuria, frequency and hematuria.  Musculoskeletal: Negative for back pain, leg pain and joint pain.  Skin: Negative for rash.  Neurological: Negative for dizziness, headaches, light-headedness, numbness and seizures.  Psychiatric: Negative for behavioral problem, confusion, depressed mood and sleep disturbance.  Objective:  Objective   Vitals:   08/07/17 1112  BP: (!) 110/58  Pulse: 73  Weight: 109 lb (49.4 kg)  Height: 5\' 1"  (1.549 m)   Body mass index is 20.6 kg/m.  Physical Exam  Constitutional: She is oriented to person, place, and time. She appears well-developed and well-nourished.  HENT:  Head: Normocephalic and atraumatic.  Eyes: EOM are normal.  Cardiovascular: Normal rate, regular rhythm and normal heart sounds.   Pulmonary/Chest: Effort normal and breath sounds normal.  Neurological: She is alert and oriented to person, place, and time.  Skin: Skin is warm and dry.  Psychiatric: She has a normal mood and affect. Her behavior is normal. Judgment and thought content normal.  Nursing note and vitals reviewed.       Assessment/Plan:    22 yo at 6 week 5 days gestation 1. Nausea and vomiting in pregnancy, tolerating liquids, no signs of dehydrations. Prescription given for bonjesta daily and phenergan as needed. Discussed BRAT diet. 2. Depression, would like to start an antidepressant- given an prescription for zoloft.  3. Follow up as planned on Tuesday for NOB and US for dating.  Adelene Idlerhristanna Pantelis Elgersma MD [facility], [facility] 08/07/17 11:55 AM

## 2017-08-07 NOTE — Patient Instructions (Signed)
First Trimester of Pregnancy The first trimester of pregnancy is from week 1 until the end of week 13 (months 1 through 3). During this time, your baby will begin to develop inside you. At 6-8 weeks, the eyes and face are formed, and the heartbeat can be seen on ultrasound. At the end of 12 weeks, all the baby's organs are formed. Prenatal care is all the medical care you receive before the birth of your baby. Make sure you get good prenatal care and follow all of your doctor's instructions. Follow these instructions at home: Medicines  Take over-the-counter and prescription medicines only as told by your doctor. Some medicines are safe and some medicines are not safe during pregnancy.  Take a prenatal vitamin that contains at least 600 micrograms (mcg) of folic acid.  If you have trouble pooping (constipation), take medicine that will make your stool soft (stool softener) if your doctor approves. Eating and drinking  Eat regular, healthy meals.  Your doctor will tell you the amount of weight gain that is right for you.  Avoid raw meat and uncooked cheese.  If you feel sick to your stomach (nauseous) or throw up (vomit): ? Eat 4 or 5 small meals a day instead of 3 large meals. ? Try eating a few soda crackers. ? Drink liquids between meals instead of during meals.  To prevent constipation: ? Eat foods that are high in fiber, like fresh fruits and vegetables, whole grains, and beans. ? Drink enough fluids to keep your pee (urine) clear or pale yellow. Activity  Exercise only as told by your doctor. Stop exercising if you have cramps or pain in your lower belly (abdomen) or low back.  Do not exercise if it is too hot, too humid, or if you are in a place of great height (high altitude).  Try to avoid standing for long periods of time. Move your legs often if you must stand in one place for a long time.  Avoid heavy lifting.  Wear low-heeled shoes. Sit and stand up straight.  You  can have sex unless your doctor tells you not to. Relieving pain and discomfort  Wear a good support bra if your breasts are sore.  Take warm water baths (sitz baths) to soothe pain or discomfort caused by hemorrhoids. Use hemorrhoid cream if your doctor says it is okay.  Rest with your legs raised if you have leg cramps or low back pain.  If you have puffy, bulging veins (varicose veins) in your legs: ? Wear support hose or compression stockings as told by your doctor. ? Raise (elevate) your feet for 15 minutes, 3-4 times a day. ? Limit salt in your food. Prenatal care  Schedule your prenatal visits by the twelfth week of pregnancy.  Write down your questions. Take them to your prenatal visits.  Keep all your prenatal visits as told by your doctor. This is important. Safety  Wear your seat belt at all times when driving.  Make a list of emergency phone numbers. The list should include numbers for family, friends, the hospital, and police and fire departments. General instructions  Ask your doctor for a referral to a local prenatal class. Begin classes no later than at the start of month 6 of your pregnancy.  Ask for help if you need counseling or if you need help with nutrition. Your doctor can give you advice or tell you where to go for help.  Do not use hot tubs, steam rooms, or   saunas.  Do not douche or use tampons or scented sanitary pads.  Do not cross your legs for long periods of time.  Avoid all herbs and alcohol. Avoid drugs that are not approved by your doctor.  Do not use any tobacco products, including cigarettes, chewing tobacco, and electronic cigarettes. If you need help quitting, ask your doctor. You may get counseling or other support to help you quit.  Avoid cat litter boxes and soil used by cats. These carry germs that can cause birth defects in the baby and can cause a loss of your baby (miscarriage) or stillbirth.  Visit your dentist. At home, brush  your teeth with a soft toothbrush. Be gentle when you floss. Contact a doctor if:  You are dizzy.  You have mild cramps or pressure in your lower belly.  You have a nagging pain in your belly area.  You continue to feel sick to your stomach, you throw up, or you have watery poop (diarrhea).  You have a bad smelling fluid coming from your vagina.  You have pain when you pee (urinate).  You have increased puffiness (swelling) in your face, hands, legs, or ankles. Get help right away if:  You have a fever.  You are leaking fluid from your vagina.  You have spotting or bleeding from your vagina.  You have very bad belly cramping or pain.  You gain or lose weight rapidly.  You throw up blood. It may look like coffee grounds.  You are around people who have German measles, fifth disease, or chickenpox.  You have a very bad headache.  You have shortness of breath.  You have any kind of trauma, such as from a fall or a car accident. Summary  The first trimester of pregnancy is from week 1 until the end of week 13 (months 1 through 3).  To take care of yourself and your unborn baby, you will need to eat healthy meals, take medicines only if your doctor tells you to do so, and do activities that are safe for you and your baby.  Keep all follow-up visits as told by your doctor. This is important as your doctor will have to ensure that your baby is healthy and growing well. This information is not intended to replace advice given to you by your health care provider. Make sure you discuss any questions you have with your health care provider. Document Released: 07/16/2007 Document Revised: 02/05/2016 Document Reviewed: 02/05/2016 Elsevier Interactive Patient Education  2017 [employer]  

## 2017-08-08 ENCOUNTER — Encounter: Payer: Self-pay | Admitting: Emergency Medicine

## 2017-08-08 ENCOUNTER — Emergency Department
Admission: EM | Admit: 2017-08-08 | Discharge: 2017-08-09 | Disposition: A | Discharge status: Home / Self Care | Payer: Medicaid Other | Attending: Emergency Medicine | Admitting: Emergency Medicine

## 2017-08-08 ENCOUNTER — Emergency Department: Payer: Medicaid Other

## 2017-08-08 DIAGNOSIS — Z87891 Personal history of nicotine dependence: Secondary | ICD-10-CM | POA: Insufficient documentation

## 2017-08-08 DIAGNOSIS — O209 Hemorrhage in early pregnancy, unspecified: Secondary | ICD-10-CM | POA: Diagnosis present

## 2017-08-08 DIAGNOSIS — Z3A01 Less than 8 weeks gestation of pregnancy: Secondary | ICD-10-CM | POA: Insufficient documentation

## 2017-08-08 HISTORY — DX: Hypoglycemia, unspecified: E16.2

## 2017-08-08 LAB — COMPREHENSIVE METABOLIC PANEL
ALK PHOS: 51 U/L (ref 38–126)
ALT: 56 U/L — ABNORMAL HIGH (ref 0–44)
AST: 35 U/L (ref 15–41)
Albumin: 4.9 g/dL (ref 3.5–5.0)
Anion gap: 8 (ref 5–15)
BUN: 13 mg/dL (ref 6–20)
CALCIUM: 9.4 mg/dL (ref 8.9–10.3)
CO2: 25 mmol/L (ref 22–32)
CREATININE: 0.51 mg/dL (ref 0.44–1.00)
Chloride: 102 mmol/L (ref 98–111)
GFR calc Af Amer: >60 mL/min (ref >60)
Glucose, Bld: 95 mg/dL (ref 70–99)
POTASSIUM: 3.8 mmol/L (ref 3.5–5.1)
Sodium: 135 mmol/L (ref 135–145)
TOTAL PROTEIN: 8.1 g/dL (ref 6.5–8.1)
Total Bilirubin: 0.8 mg/dL (ref 0.3–1.2)

## 2017-08-08 LAB — CBC WITH DIFFERENTIAL/PLATELET
BASOS ABS: 0.1 10*3/uL (ref 0–0.1)
Basophils Relative: 1 %
Eosinophils Absolute: 0.1 10*3/uL (ref 0–0.7)
Eosinophils Relative: 1 %
HEMATOCRIT: 38.3 % (ref 35.0–47.0)
HEMOGLOBIN: 13.4 g/dL (ref 12.0–16.0)
LYMPHS PCT: 28 %
Lymphs Abs: 3 10*3/uL (ref 1.0–3.6)
MCH: 30.1 pg (ref 26.0–34.0)
MCHC: 34.9 g/dL (ref 32.0–36.0)
MCV: 86.1 fL (ref 80.0–100.0)
MONO ABS: 0.9 10*3/uL (ref 0.2–0.9)
MONOS PCT: 8 %
NEUTROS ABS: 6.5 10*3/uL (ref 1.4–6.5)
Neutrophils Relative %: 62 %
Platelets: 365 10*3/uL (ref 150–440)
RBC: 4.45 MIL/uL (ref 3.80–5.20)
RDW: 12.8 % (ref 11.5–14.5)
WBC: 10.5 10*3/uL (ref 3.6–11.0)

## 2017-08-08 LAB — URINALYSIS, COMPLETE (UACMP) WITH MICROSCOPIC
Bacteria, UA: NONE SEEN
Bilirubin Urine: NEGATIVE
GLUCOSE, UA: NEGATIVE mg/dL
Ketones, ur: NEGATIVE mg/dL
LEUKOCYTES UA: NEGATIVE
Nitrite: NEGATIVE
PH: 6 (ref 5.0–8.0)
Protein, ur: NEGATIVE mg/dL
Specific Gravity, Urine: 1.017 (ref 1.005–1.030)

## 2017-08-08 LAB — ABO/RH: ABO/RH(D): A POS

## 2017-08-08 LAB — POCT PREGNANCY, URINE: Preg Test, Ur: POSITIVE — AB

## 2017-08-08 LAB — HCG, QUANTITATIVE, PREGNANCY: HCG, BETA CHAIN, QUANT, S: 77330 m[IU]/mL — AB (ref <5)

## 2017-08-08 NOTE — ED Notes (Signed)
Over to speak with pt regarding ordered US; pt noted to be eating pizza and drinking soda; asked pt to stop eating at this point until cleared by MD; verbalized understanding;

## 2017-08-08 NOTE — ED Provider Notes (Signed)
[facility] Emergency Department Provider Note   First MD Initiated Contact with Patient 08/08/17 2329     (approximate)  I have reviewed the triage vital signs and the nursing notes.   HISTORY  Chief Complaint Vaginal Bleeding   HPI Stacey Donovan is a 22 y.o. female G1, P0 [redacted] weeks pregnant presents to the emergency department with acute onset of vaginal bleeding which began 30 minutes before arrival.  Patient states that more than spotting but less than a period in nature.  Patient does admit to some abdominal cramping which she states that she has had during the course of her pregnancy.  Patient does admit to sexual intercourse last night.  Past Medical History:  Diagnosis Date  . Acute appendicitis with generalized peritonitis   . Appendicitis 09/04/2016  . Hypoglycemia     Patient Active Problem List   Diagnosis Date Noted  . Appendicitis 09/04/2016  . Acute appendicitis with generalized peritonitis     Past Surgical History:  Procedure Laterality Date  . LAPAROSCOPIC APPENDECTOMY N/A 09/04/2016   Procedure: APPENDECTOMY LAPAROSCOPIC;  Surgeon: Ricarda FrameWoodham, Charles, MD;  Location: [facility] ORS;  Service: General;  Laterality: N/A;    Prior to Admission medications   Medication Sig Start Date End Date Taking? Authorizing Provider  Doxylamine-Pyridoxine ER (BONJESTA) 20-20 MG TBCR Take 1 tablet by mouth 2 (two) times daily. Sig 1 tab po  daily at bedtime, 1 tab po daily morning 08/07/17 10/06/17  Adelene IdlerSchuman, Christanna R, MD  HYDROcodone-acetaminophen (NORCO/VICODIN) 5-325 MG tablet Take 1-2 tablets by mouth every 4 (four) hours as needed for moderate pain. Patient not taking: Reported on 08/07/2017 09/05/16   Ricarda FrameWoodham, Charles, MD  ondansetron (ZOFRAN-ODT) 4 MG disintegrating tablet Take 1 tablet (4 mg total) by mouth every 6 (six) hours as needed for nausea. Patient not taking: Reported on 08/07/2017 09/05/16   Ricarda FrameWoodham, Charles, MD  promethazine (PHENERGAN)  25 MG tablet Take 1 tablet (25 mg total) by mouth every 6 (six) hours as needed for nausea or vomiting. 08/07/17   Schuman, Christanna R, MD  sertraline (ZOLOFT) 50 MG tablet Take 1 tablet (50 mg total) by mouth daily. 08/07/17   Natale MilchSchuman, Christanna R, MD    Allergies No known drug allergies No family history on file.  Social History Social History   Tobacco Use  . Smoking status: Former Smoker    Packs/day: 1.00    Types: Cigarettes  . Smokeless tobacco: Never Used  Substance Use Topics  . Alcohol use: No  . Drug use: Not Currently    Types: Marijuana    Review of Systems Constitutional: No fever/chills Eyes: No visual changes. ENT: No sore throat. Cardiovascular: Denies chest pain. Respiratory: Denies shortness of breath. Gastrointestinal: No abdominal pain.  No nausea, no vomiting.  No diarrhea.  No constipation. Genitourinary: Negative for dysuria.  Positive for vaginal bleeding Musculoskeletal: Negative for neck pain.  Negative for back pain. Integumentary: Negative for rash. Neurological: Negative for headaches, focal weakness or numbness.  ____________________________________________   PHYSICAL EXAM:  VITAL SIGNS: ED Triage Vitals  Enc Vitals Group     BP --      Pulse Rate 08/08/17 1909 90     Resp 08/08/17 1909 18     Temp 08/08/17 1909 98 F (36.7 C)     Temp Source 08/08/17 1909 Oral     SpO2 --      Weight 08/08/17 1912 49.4 kg (109 lb)     Height 08/08/17 1912  1.549 m (5\' 1" )     Head Circumference --      Peak Flow --      Pain Score 08/08/17 1910 4     Pain Loc --      Pain Edu? --      Excl. in GC? --     Constitutional: Alert and oriented. Well appearing and in no acute distress. Eyes: Conjunctivae are normal. Head: Atraumatic. Mouth/Throat: Mucous membranes are moist.  Oropharynx non-erythematous. Neck: No stridor.   Cardiovascular: Normal rate, regular rhythm. Good peripheral circulation. Grossly normal heart sounds. Respiratory:  Normal respiratory effort.  No retractions. Lungs CTAB. Gastrointestinal: Soft and nontender. No distention.  Genitourinary: Cannot dark blood noted in the vagina.  No active bleeding cervix closed Musculoskeletal: No lower extremity tenderness nor edema. No gross deformities of extremities. Neurologic:  Normal speech and language. No gross focal neurologic deficits are appreciated.  Skin:  Skin is warm, dry and intact. No rash noted.   ____________________________________________   LABS (all labs ordered are listed, but only abnormal results are displayed)  Labs Reviewed  HCG, QUANTITATIVE, PREGNANCY - Abnormal; Notable for the following components:      Result Value   hCG, Beta Chain, Quant, S 77,330 (*)    All other components within normal limits  COMPREHENSIVE METABOLIC PANEL - Abnormal; Notable for the following components:   ALT 56 (*)    All other components within normal limits  URINALYSIS, COMPLETE (UACMP) WITH MICROSCOPIC - Abnormal; Notable for the following components:   Color, Urine YELLOW (*)    APPearance HAZY (*)    Hgb urine dipstick MODERATE (*)    All other components within normal limits  POCT PREGNANCY, URINE - Abnormal; Notable for the following components:   Preg Test, Ur POSITIVE (*)    All other components within normal limits  CBC WITH DIFFERENTIAL/PLATELET  POC URINE PREG, ED  ABO/RH   ___________ RADIOLOGY I, Simms Dewayne Shorter, personally viewed and evaluated these images (plain radiographs) as part of my medical decision making, as well as reviewing the written report by the radiologist.  ED MD interpretation: Single live IUP 6 weeks no days with a heart rate of 132 and ultrasound per radiologist.  Official radiology report(s): US Ob Comp Less 14 Wks  Result Date: 08/08/2017 CLINICAL DATA:  22 year old pregnant female with vaginal bleeding. LMP: 06/21/2017 corresponding to an estimated gestational age of [redacted] weeks, 6 days. EXAM: OBSTETRIC <14 WK [place]  AND TRANSVAGINAL OB US TECHNIQUE: Both transabdominal and transvaginal ultrasound examinations were performed for complete evaluation of the gestation as well as the maternal uterus, adnexal regions, and pelvic cul-de-sac. Transvaginal technique was performed to assess early pregnancy. COMPARISON:  None. FINDINGS: Intrauterine gestational sac: Single intrauterine gestational sac. Yolk sac:  Seen Embryo:  Present Cardiac Activity: Detected Heart Rate: 132 bpm CRL:  39 mm   6 w   0 d                  [place] EDC: 04/03/2018 Subchorionic hemorrhage:  None visualized. 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 The gestational sac appears to be in the left horn of the uterus. Maternal ovaries are unremarkable. There is a complex/hemorrhagic corpus luteum in the left ovary. IMPRESSION: 1. Single live intrauterine pregnancy with an estimated gestational age of [redacted] weeks, 0 days based on today's ultrasound. 2. 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 The gestational sac appears within the left  horn of the uterus. Electronically Signed   By: Elgie Collard M.D.   On: 08/08/2017 21:49   US Ob Transvaginal  Result Date: 08/08/2017 CLINICAL DATA:  22 year old pregnant female with vaginal bleeding. LMP: 06/21/2017 corresponding to an estimated gestational age of [redacted] weeks, 6 days. EXAM: OBSTETRIC <14 WK [place] AND TRANSVAGINAL OB US TECHNIQUE: Both transabdominal and transvaginal ultrasound examinations were performed for complete evaluation of the gestation as well as the maternal uterus, adnexal regions, and pelvic cul-de-sac. Transvaginal technique was performed to assess early pregnancy. COMPARISON:  None. FINDINGS: Intrauterine gestational sac: Single intrauterine gestational sac. Yolk sac:  Seen Embryo:  Present Cardiac Activity: Detected Heart Rate: 132 bpm CRL:  39 mm   6 w   0 d                  [place] EDC: 04/03/2018 Subchorionic hemorrhage:  None visualized. 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 The gestational sac appears to be in the left horn of the uterus. Maternal ovaries are unremarkable. There is a complex/hemorrhagic corpus luteum in the left ovary. IMPRESSION: 1. Single live intrauterine pregnancy with an estimated gestational age of [redacted] weeks, 0 days based on today's ultrasound. 2. 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 The gestational sac appears within the left horn of the uterus. Electronically Signed   By: Elgie Collard M.D.   On: 08/08/2017 21:49    _________________________  Procedures   ____________________________________________   INITIAL IMPRESSION / ASSESSMENT AND PLAN / ED COURSE  As part of my medical decision making, I reviewed the following data within the electronic MEDICAL RECORD NUMBER   22 year old female presenting to the emergency department above-stated history and physical exam secondary to vaginal bleeding in the first trimester.  Vaginal exam revealed old blood with no active bleeding with a cervical office that was closed.  Ultrasound revealed a single live IUP at 6 weeks no days.  Spoke with the patient and family at length regarding need for follow-up with OB/GYN.  Patient's blood type is a positive ____________________________________________  FINAL CLINICAL IMPRESSION(S) / ED DIAGNOSES  Final diagnoses:  Vaginal bleeding in pregnancy, first trimester     MEDICATIONS GIVEN DURING THIS VISIT:  Medications - No data to display   ED Discharge Orders    None       Note:  This document was prepared using Dragon voice recognition software and may include unintentional dictation errors.    Darci Current, MD 08/09/17 [phone]

## 2017-08-08 NOTE — ED Notes (Signed)
Pt waiting patiently in WR; updated on wait time; verbalized understanding

## 2017-08-08 NOTE — ED Triage Notes (Addendum)
Patient states that she is [redacted] weeks pregnant and that about 30 minutes ago she started having a small amount of vaginal bleeding. Patient with complaint of lower abdominal pain but states that it is the same pain she has been having throughout her pregnancy.

## 2017-08-08 NOTE — ED Notes (Addendum)
Pt ambulated to room 4 with family and a steady gait. Requesting ice chips. Labs and ultrasound are good so verified and gave ice to pt

## 2017-08-11 ENCOUNTER — Other Ambulatory Visit (HOSPITAL_COMMUNITY)
Admission: RE | Admit: 2017-08-11 | Discharge: 2017-08-11 | Disposition: A | Discharge status: Home / Self Care | Payer: Medicaid Other | Source: Ambulatory Visit | Attending: Advanced Practice Midwife | Admitting: Advanced Practice Midwife

## 2017-08-11 ENCOUNTER — Encounter: Payer: Self-pay | Admitting: Advanced Practice Midwife

## 2017-08-11 ENCOUNTER — Ambulatory Visit (INDEPENDENT_AMBULATORY_CARE_PROVIDER_SITE_OTHER): Payer: Medicaid Other | Admitting: Advanced Practice Midwife

## 2017-08-11 ENCOUNTER — Other Ambulatory Visit: Payer: Self-pay | Admitting: Obstetrics and Gynecology

## 2017-08-11 ENCOUNTER — Ambulatory Visit (INDEPENDENT_AMBULATORY_CARE_PROVIDER_SITE_OTHER): Payer: Medicaid Other

## 2017-08-11 VITALS — BP 100/68 | HR 93 | Wt 108.0 lb

## 2017-08-11 DIAGNOSIS — Z113 Encounter for screening for infections with a predominantly sexual mode of transmission: Secondary | ICD-10-CM | POA: Diagnosis not present

## 2017-08-11 DIAGNOSIS — Q513 Bicornate uterus: Secondary | ICD-10-CM

## 2017-08-11 DIAGNOSIS — O219 Vomiting of pregnancy, unspecified: Secondary | ICD-10-CM

## 2017-08-11 DIAGNOSIS — O0990 Supervision of high risk pregnancy, unspecified, unspecified trimester: Secondary | ICD-10-CM | POA: Insufficient documentation

## 2017-08-11 DIAGNOSIS — N8312 Corpus luteum cyst of left ovary: Secondary | ICD-10-CM | POA: Diagnosis not present

## 2017-08-11 DIAGNOSIS — O3400 Maternal care for unspecified congenital malformation of uterus, unspecified trimester: Secondary | ICD-10-CM

## 2017-08-11 DIAGNOSIS — O3481 Maternal care for other abnormalities of pelvic organs, first trimester: Secondary | ICD-10-CM | POA: Diagnosis not present

## 2017-08-11 DIAGNOSIS — Z3A01 Less than 8 weeks gestation of pregnancy: Secondary | ICD-10-CM | POA: Diagnosis not present

## 2017-08-11 NOTE — Patient Instructions (Signed)
Exercise During Pregnancy For people of all ages, exercise is an important part of being healthy. Exercise improves heart and lung function and helps to maintain strength, flexibility, and a healthy body weight. Exercise also boosts energy levels and elevates mood. For most women, maintaining an exercise routine throughout pregnancy is recommended. It is only on rare occasions and with certain medical conditions or pregnancy complications that women may be asked to limit or avoid exercise during pregnancy. What are some other benefits to exercising during pregnancy? Along with maintaining strength and flexibility, exercising throughout pregnancy can help to:  Keep strength in muscles that are very important during labor and childbirth.  Decrease low back pain during pregnancy.  Decrease the risk of developing gestational diabetes mellitus (GDM).  Improve blood sugar (glucose) control for women who have GDM.  Decrease the risk of developing preeclampsia. This is a serious condition that causes high blood pressure along with other symptoms, such as swelling and headaches.  Decrease the risk of cesarean delivery.  Speed up the recovery after giving birth.  How often should I exercise? Unless your health care provider gives you different instructions, you should try to exercise on most days or all days of the week. In general, try to exercise with moderate intensity for about 150 minutes per week. This can be spread out across several days, such as exercising for 30 minutes per day on 5 days of each week. You can tell that you are exercising at a moderate intensity if you have a higher heart rate and faster breathing, but you are still able to hold a conversation. What types of moderate-intensity exercise are recommended during pregnancy? There are many types of exercise that are safe for you to do during pregnancy. Unless your health care provider gives you different instructions, do a variety of  exercises that safely increase your heart and breathing (cardiopulmonary) rates and help you to build and maintain muscle strength (strength training). You should always be able to talk in full sentences while exercising during pregnancy. Some examples of exercising that is safe to do during pregnancy include:  Brisk walking or hiking.  Swimming.  Water aerobics.  Riding a stationary bike.  Strength training.  Modified yoga or Pilates. Tell your instructor that you are pregnant. Avoid overstretching and avoid lying on your back for long periods of time.  Running or jogging. Only choose this type of exercise if: ? You ran or jogged regularly before your pregnancy. ? You can run or jog and still talk in complete sentences.  What types of exercise should I not do during pregnancy? Depending on your level of fitness and whether you exercised regularly before your pregnancy, you may be advised to limit vigorous-intensity exercise during your pregnancy. You can tell that you are exercising at a vigorous intensity if you are breathing much harder and faster and cannot hold a conversation while exercising. Some examples of exercising that you should avoid during pregnancy include:  Contact sports.  Activities that place you at risk for falling on or being hit in the belly, such as downhill skiing, water skiing, surfing, rock climbing, cycling, gymnastics, and horseback riding.  Scuba diving.  Sky diving.  Yoga or Pilates in a room that is heated to extreme temperatures ("hot yoga" or "hot Pilates").  Jogging or running, unless you ran or jogged regularly before your pregnancy. While jogging or running, you should always be able to talk in full sentences. Do not run or jog so vigorously   that you are unable to have a conversation.  If you are not used to exercising at elevation (more than 6,000 feet above sea level), do not do so during your pregnancy.  When should I avoid exercising  during pregnancy? Certain medical conditions can make it unsafe to exercise during pregnancy, or they may increase your risk of miscarriage or early labor and birth. Some of these conditions include:  Some types of heart disease.  Some types of lung disease.  Placenta previa. This is when the placenta partially or completely covers the opening of the uterus (cervix).  Frequent bleeding from the vagina during your pregnancy.  Incompetent cervix. This is when your cervix does not remain as tightly closed during pregnancy as it should.  Premature labor.  Ruptured membranes. This is when the protective sac (amniotic sac) opens up and amniotic fluid leaks from your vagina.  Severely low blood count (anemia).  Preeclampsia or pregnancy-caused high blood pressure.  Carrying more than one baby (multiple gestation) and having an additional risk of early labor.  Poorly controlled diabetes.  Being severely underweight or severely overweight.  Intrauterine growth restriction. This is when your baby's growth and development during pregnancy are slower than expected.  Other medical conditions. Ask your health care provider if any apply to you.  What else should I know about exercising during pregnancy? You should take these precautions while exercising during pregnancy:  Avoid overheating. ? Wear loose-fitting, breathable clothes. ? Do not exercise in very high temperatures.  Avoid dehydration. Drink enough water before, during, and after exercise to keep your urine clear or pale yellow.  Avoid overstretching. Because of hormone changes during pregnancy, it is easy to overstretch muscles, tendons, and ligaments during pregnancy.  Start slowly and ask your health care provider to recommend types of exercise that are safe for you, if exercising regularly is new for you.  Pregnancy is not a time for exercising to lose weight. When should I seek medical care? You should stop exercising  and call your health care provider if you have any unusual symptoms, such as:  Mild uterine contractions or abdominal cramping.  Dizziness that does not improve with rest.  When should I seek immediate medical care? You should stop exercising and call your local emergency services (911 in the [place] if you have any unusual symptoms, such as:  Sudden, severe pain in your low back or your belly.  Uterine contractions or abdominal cramping that do not improve with rest.  Chest pain.  Bleeding or fluid leaking from your vagina.  Shortness of breath.  This information is not intended to replace advice given to you by your health care provider. Make sure you discuss any questions you have with your health care provider. Document Released: 01/27/2005 Document Revised: 06/27/2015 Document Reviewed: 04/06/2014 Elsevier Interactive Patient Education  2018 [employer] Eating Plan for Pregnant Women While you are pregnant, your body will require additional nutrition to help support your growing baby. It is recommended that you consume:  150 additional calories each day during your first trimester.  300 additional calories each day during your second trimester.  300 additional calories each day during your third trimester.  Eating a healthy, well-balanced diet is very important for your health and for your baby's health. You also have a higher need for some vitamins and minerals, such as folic acid, calcium, iron, and vitamin D. What do I need to know about eating during pregnancy?  Do not try to lose weight   or go on a diet during pregnancy.  Choose healthy, nutritious foods. Choose  of a sandwich with a glass of milk instead of a candy bar or a high-calorie sugar-sweetened beverage.  Limit your overall intake of foods that have "empty calories." These are foods that have little nutritional value, such as sweets, desserts, candies, sugar-sweetened beverages, and fried foods.  Eat a  variety of foods, especially fruits and vegetables.  Take a prenatal vitamin to help meet the additional needs during pregnancy, specifically for folic acid, iron, calcium, and vitamin D.  Remember to stay active. Ask your health care provider for exercise recommendations that are specific to you.  Practice good food safety and cleanliness, such as washing your hands before you eat and after you prepare raw meat. This helps to prevent foodborne illnesses, such as listeriosis, that can be very dangerous for your baby. Ask your health care provider for more information about listeriosis. What does 150 extra calories look like? Healthy options for an additional 150 calories each day could be any of the following:  Plain low-fat yogurt (6-8 oz) with  cup of berries.  1 apple with 2 teaspoons of peanut butter.  Cut-up vegetables with  cup of hummus.  Low-fat chocolate milk (8 oz or 1 cup).  1 string cheese with 1 medium orange.   of a peanut butter and jelly sandwich on whole-wheat bread (1 tsp of peanut butter).  For 300 calories, you could eat two of those healthy options each day. What is a healthy amount of weight to gain? The recommended amount of weight for you to gain is based on your pre-pregnancy BMI. If your pre-pregnancy BMI was:  Less than 18 (underweight), you should gain 28-40 lb.  18-24.9 (normal), you should gain 25-35 lb.  25-29.9 (overweight), you should gain 15-25 lb.  Greater than 30 (obese), you should gain 11-20 lb.  What if I am having twins or multiples? Generally, pregnant women who will be having twins or multiples may need to increase their daily calories by 300-600 calories each day. The recommended range for total weight gain is 25-54 lb, depending on your pre-pregnancy BMI. Talk with your health care provider for specific guidance about additional nutritional needs, weight gain, and exercise during your pregnancy. What foods can I eat? Grains Any  grains. Try to choose whole grains, such as whole-wheat bread, oatmeal, or brown rice. Vegetables Any vegetables. Try to eat a variety of colors and types of vegetables to get a full range of vitamins and minerals. Remember to wash your vegetables well before eating. Fruits Any fruits. Try to eat a variety of colors and types of fruit to get a full range of vitamins and minerals. Remember to wash your fruits well before eating. Meats and Other Protein Sources Lean meats, including chicken, turkey, fish, and lean cuts of beef, veal, or pork. Make sure that all meats are cooked to "well done." Tofu. Tempeh. Beans. Eggs. Peanut butter and other nut butters. Seafood, such as shrimp, crab, and lobster. If you choose fish, select types that are higher in omega-3 fatty acids, including salmon, herring, mussels, trout, sardines, and pollock. Make sure that all meats are cooked to food-safe temperatures. Dairy Pasteurized milk and milk alternatives. Pasteurized yogurt and pasteurized cheese. Cottage cheese. Sour cream. Beverages Water. Juices that contain 100% fruit juice or vegetable juice. Caffeine-free teas and decaffeinated coffee. Drinks that contain caffeine are okay to drink, but it is better to avoid caffeine. Keep your total caffeine   intake to less than 200 mg each day (12 oz of coffee, tea, or soda) or as directed by your health care provider. Condiments Any pasteurized condiments. Sweets and Desserts Any sweets and desserts. Fats and Oils Any fats and oils. The items listed above may not be a complete list of recommended foods or beverages. Contact your dietitian for more options. What foods are not recommended? Vegetables Unpasteurized (raw) vegetable juices. Fruits Unpasteurized (raw) fruit juices. Meats and Other Protein Sources Cured meats that have nitrates, such as bacon, salami, and hotdogs. Luncheon meats, bologna, or other deli meats (unless they are reheated until they are  steaming hot). Refrigerated pate, meat spreads from a meat counter, smoked seafood that is found in the refrigerated section of a store. Raw fish, such as sushi or sashimi. High mercury content fish, such as tilefish, shark, swordfish, and king mackerel. Raw meats, such as tuna or beef tartare. Undercooked meats and poultry. Make sure that all meats are cooked to food-safe temperatures. Dairy Unpasteurized (raw) milk and any foods that have raw milk in them. Soft cheeses, such as feta, queso blanco, queso fresco, Brie, Camembert cheeses, blue-veined cheeses, and Panela cheese (unless it is made with pasteurized milk, which must be stated on the label). Beverages Alcohol. Sugar-sweetened beverages, such as sodas, teas, or energy drinks. Condiments Homemade fermented foods and drinks, such as pickles, sauerkraut, or kombucha drinks. (Store-bought pasteurized versions of these are okay.) Other Salads that are made in the store, such as ham salad, chicken salad, egg salad, tuna salad, and seafood salad. The items listed above may not be a complete list of foods and beverages to avoid. Contact your dietitian for more information. This information is not intended to replace advice given to you by your health care provider. Make sure you discuss any questions you have with your health care provider. Document Released: 11/11/2013 Document Revised: 07/05/2015 Document Reviewed: 07/12/2013 Else[employer] Interactive Patient Education  2018 [employer] Prenatal Care WHAT IS PRENATAL CARE? Prenatal care is the process of caring for a pregnant woman before she gives birth. Prenatal care makes sure that she and her baby remain as healthy as possible throughout pregnancy. Prenatal care may be provided by a midwife, family practice health care provider, or a childbirth and pregnancy specialist (obstetrician). Prenatal care may include physical examinations, testing, treatments, and education on nutrition, lifestyle, and  social support services. WHY IS PRENATAL CARE SO IMPORTANT? Early and consistent prenatal care increases the chance that you and your baby will remain healthy throughout your pregnancy. This type of care also decreases a baby's risk of being born too early (prematurely), or being born smaller than expected (small for gestational age). Any underlying medical conditions you may have that could pose a risk during your pregnancy are discussed during prenatal care visits. You will also be monitored regularly for any new conditions that may arise during your pregnancy so they can be treated quickly and effectively. WHAT HAPPENS DURING PRENATAL CARE VISITS? Prenatal care visits may include the following: Discussion Tell your health care provider about any new signs or symptoms you have experienced since your last visit. These might include:  Nausea or vomiting.  Increased or decreased level of energy.  Difficulty sleeping.  Back or leg pain.  Weight changes.  Frequent urination.  Shortness of breath with physical activity.  Changes in your skin, such as the development of a rash or itchiness.  Vaginal discharge or bleeding.  Feelings of excitement or nervousness.  Changes in   your baby's movements.  You may want to write down any questions or topics you want to discuss with your health care provider and bring them with you to your appointment. Examination During your first prenatal care visit, you will likely have a complete physical exam. Your health care provider will often examine your vagina, cervix, and the position of your uterus, as well as check your heart, lungs, and other body systems. As your pregnancy progresses, your health care provider will measure the size of your uterus and your baby's position inside your uterus. He or she may also examine you for early signs of labor. Your prenatal visits may also include checking your blood pressure and, after about 10-12 weeks of  pregnancy, listening to your baby's heartbeat. Testing Regular testing often includes:  Urinalysis. This checks your urine for glucose, protein, or signs of infection.  Blood count. This checks the levels of white and red blood cells in your body.  Tests for sexually transmitted infections (STIs). Testing for STIs at the beginning of pregnancy is routinely done and is required in many states.  Antibody testing. You will be checked to see if you are immune to certain illnesses, such as rubella, that can affect a developing fetus.  Glucose screen. Around 24-28 weeks of pregnancy, your blood glucose level will be checked for signs of gestational diabetes. Follow-up tests may be recommended.  Group B strep. This is a bacteria that is commonly found inside a woman's vagina. This test will inform your health care provider if you need an antibiotic to reduce the amount of this bacteria in your body prior to labor and childbirth.  Ultrasound. Many pregnant women undergo an ultrasound screening around 18-20 weeks of pregnancy to evaluate the health of the fetus and check for any developmental abnormalities.  HIV (human immunodeficiency virus) testing. Early in your pregnancy, you will be screened for HIV. If you are at high risk for HIV, this test may be repeated during your third trimester of pregnancy.  You may be offered other testing based on your age, personal or family medical history, or other factors. HOW OFTEN SHOULD I PLAN TO SEE MY HEALTH CARE PROVIDER FOR PRENATAL CARE? Your prenatal care check-up schedule depends on any medical conditions you have before, or develop during, your pregnancy. If you do not have any underlying medical conditions, you will likely be seen for checkups:  Monthly, during the first 6 months of pregnancy.  Twice a month during months 7 and 8 of pregnancy.  Weekly starting in the 9th month of pregnancy and until delivery.  If you develop signs of early labor  or other concerning signs or symptoms, you may need to see your health care provider more often. Ask your health care provider what prenatal care schedule is best for you. WHAT CAN I DO TO KEEP MYSELF AND MY BABY AS HEALTHY AS POSSIBLE DURING MY PREGNANCY?  Take a prenatal vitamin containing 400 micrograms (0.4 mg) of folic acid every day. Your health care provider may also ask you to take additional vitamins such as iodine, vitamin D, iron, copper, and zinc.  Take 1500-2000 mg of calcium daily starting at your 20th week of pregnancy until you deliver your baby.  Make sure you are up to date on your vaccinations. Unless directed otherwise by your health care provider: ? You should receive a tetanus, diphtheria, and pertussis (Tdap) vaccination between the 27th and 36th week of your pregnancy, regardless of when your last Tdap immunization   occurred. This helps protect your baby from whooping cough (pertussis) after he or she is born. ? You should receive an annual inactivated influenza vaccine (IIV) to help protect you and your baby from influenza. This can be done at any point during your pregnancy.  Eat a well-rounded diet that includes: ? Fresh fruits and vegetables. ? Lean proteins. ? Calcium-rich foods such as milk, yogurt, hard cheeses, and dark, leafy greens. ? Whole grain breads.  Do noteat seafood high in mercury, including: ? Swordfish. ? Tilefish. ? Shark. ? King mackerel. ? More than 6 oz tuna per week.  Do not eat: ? Raw or undercooked meats or eggs. ? Unpasteurized foods, such as soft cheeses (brie, blue, or feta), juices, and milks. ? Lunch meats. ? Hot dogs that have not been heated until they are steaming.  Drink enough water to keep your urine clear or pale yellow. For many women, this may be 10 or more 8 oz glasses of water each day. Keeping yourself hydrated helps deliver nutrients to your baby and may prevent the start of pre-term uterine contractions.  Do not use  any tobacco products including cigarettes, chewing tobacco, or electronic cigarettes. If you need help quitting, ask your health care provider.  Do not drink beverages containing alcohol. No safe level of alcohol consumption during pregnancy has been determined.  Do not use any illegal drugs. These can harm your developing baby or cause a miscarriage.  Ask your health care provider or pharmacist before taking any prescription or over-the-counter medicines, herbs, or supplements.  Limit your caffeine intake to no more than 200 mg per day.  Exercise. Unless told otherwise by your health care provider, try to get 30 minutes of moderate exercise most days of the week. Do not  do high-impact activities, contact sports, or activities with a high risk of falling, such as horseback riding or downhill skiing.  Get plenty of rest.  Avoid anything that raises your body temperature, such as hot tubs and saunas.  If you own a cat, do not empty its litter box. Bacteria contained in cat feces can cause an infection called toxoplasmosis. This can result in serious harm to the fetus.  Stay away from chemicals such as insecticides, lead, mercury, and cleaning or paint products that contain solvents.  Do not have any X-rays taken unless medically necessary.  Take a childbirth and breastfeeding preparation class. Ask your health care provider if you need a referral or recommendation.  This information is not intended to replace advice given to you by your health care provider. Make sure you discuss any questions you have with your health care provider. Document Released: 01/30/2003 Document Revised: 07/02/2015 Document Reviewed: 04/13/2013 [employer] Interactive Patient Education  2017 [employer]  

## 2017-08-11 NOTE — Progress Notes (Signed)
NOB ER follow up Dating scan today

## 2017-08-12 NOTE — Progress Notes (Signed)
New Obstetric Patient H&P    Chief Complaint: "Desires prenatal care"   History of Present Illness: Patient is a 22 y.o. G1P0 Not Hispanic or Latino female, presents with amenorrhea and positive home pregnancy test. Patient's last menstrual period was 06/21/2017 (approximate). and based on 6 week ultrasound, her EDD is Estimated Date of Delivery: 04/03/18 and her EGA is [redacted]w[redacted]d. Cycles are 7. days, regular, and occur approximately every : 28 days. She reports that she had a PAP smear a few years ago and that it was normal. No record of PAP smear on Care Everywhere- only STD testing. No PAP today given patient's age.   She had a urine pregnancy test which was positive 2 week(s)  ago. Her last menstrual period was normal and lasted for  7 day(s). Since her LMP she claims she has experienced breast tenderness, fatigue, nausea and occasional vomiting. She denies vaginal bleeding at this time. She was seen in the ER for spotting on 08/08/2017 Her past medical history is contributory for bicornuate uterus. This is her first pregnancy.  Since her LMP, she admits to the use of tobacco products  No she quit smoking with positive pregnancy test She claims she has gained   5 pounds since the start of her pregnancy.  There are cats in the home in the home  no  She admits close contact with children on a regular basis  yes  She has had chicken pox in the past yes She has had Tuberculosis exposures, symptoms, or previously tested positive for TB   no Current or past history of domestic violence. no  Genetic Screening/Teratology Counseling: (Includes patient, baby's father, or anyone in either family with:)   1. Patient's age >/= 22 [facility]  no 2. Thalassemia ([place], [place], Mediterranean, or Asian background): MCV<80  no 3. Neural tube defect (meningomyelocele, spina bifida, anencephaly)  no 4. Congenital heart defect  no  5. Down syndrome  no 6. Tay-Sachs (Jewish, [place]  no 7. Canavan's  Disease  no 8. Sickle cell disease or trait (African)  no  9. Hemophilia or other blood disorders  no  10. Muscular dystrophy  no  11. Cystic fibrosis  no  12. Huntington's Chorea  no  13. Mental retardation/autism  no 14. Other inherited genetic or chromosomal disorder  no 15. Maternal metabolic disorder (DM, PKU, etc)  no 16. Patient or FOB with a child with a birth defect not listed above no  16a. Patient or FOB with a birth defect themselves no 17. Recurrent pregnancy loss, or stillbirth  no  18. Any medications since LMP other than prenatal vitamins (include vitamins, supplements, OTC meds, drugs, alcohol)  Zoloft, Phenergan, Bonjesta 19. Any other genetic/environmental exposure to discuss  no  Infection History:   1. Lives with someone with TB or TB exposed  no  2. Patient or partner has history of genital herpes  no 3. Rash or viral illness since LMP  no 4. History of STI (GC, CT, HPV, syphilis, HIV)  Positive Chlamydia 2015 5. History of recent travel :  no  Other pertinent information:  no     Review of Systems:10 point review of systems negative unless otherwise noted in HPI  Past Medical History:  Past Medical History:  Diagnosis Date  . Acute appendicitis with generalized peritonitis   . Appendicitis 09/04/2016  . Hypoglycemia     Past Surgical History:  Past Surgical History:  Procedure Laterality Date  . LAPAROSCOPIC APPENDECTOMY N/A 09/04/2016  Procedure: APPENDECTOMY LAPAROSCOPIC;  Surgeon: Ricarda FrameWoodham, Charles, MD;  Location: [facility] ORS;  Service: General;  Laterality: N/A;    Gynecologic History: Patient's last menstrual period was 06/21/2017 (approximate).  Obstetric History: G1P0  Family History:  History reviewed. No pertinent family history.  Social History:  Social History   Socioeconomic History  . Marital status: Single    Spouse name: Not on file  . Number of children: Not on file  . Years of education: Not on file  . Highest education  level: Not on file  Occupational History  . Not on file  Social Needs  . Financial resource strain: Not on file  . Food insecurity:    Worry: Not on file    Inability: Not on file  . Transportation needs:    Medical: Not on file    Non-medical: Not on file  Tobacco Use  . Smoking status: Former Smoker    Packs/day: 1.00    Types: Cigarettes  . Smokeless tobacco: Never Used  Substance and Sexual Activity  . Alcohol use: No  . Drug use: Not Currently    Types: Marijuana  . Sexual activity: Not on file  Lifestyle  . Physical activity:    Days per week: Not on file    Minutes per session: Not on file  . Stress: Not on file  Relationships  . Social connections:    Talks on phone: Not on file    Gets together: Not on file    Attends religious service: Not on file    Active member of club or organization: Not on file    Attends meetings of clubs or organizations: Not on file    Relationship status: Not on file  . Intimate partner violence:    Fear of current or ex partner: Not on file    Emotionally abused: Not on file    Physically abused: Not on file    Forced sexual activity: Not on file  Other Topics Concern  . Not on file  Social History Narrative  . Not on file    Allergies:  No Known Allergies  Medications: Prior to Admission medications   Medication Sig Start Date End Date Taking? Authorizing Provider  Doxylamine-Pyridoxine ER (BONJESTA) 20-20 MG TBCR Take 1 tablet by mouth 2 (two) times daily. Sig 1 tab po  daily at bedtime, 1 tab po daily morning 08/07/17 10/06/17 Yes Schuman, Christanna R, MD  promethazine (PHENERGAN) 25 MG tablet Take 1 tablet (25 mg total) by mouth every 6 (six) hours as needed for nausea or vomiting. 08/07/17  Yes Schuman, Christanna R, MD  sertraline (ZOLOFT) 50 MG tablet Take 1 tablet (50 mg total) by mouth daily. 08/07/17  Yes Schuman, Jaquelyn Bitterhristanna R, MD    Physical Exam Vitals: Blood pressure 100/68, pulse 93, weight 108 lb (49 kg), last  menstrual period 06/21/2017.  General: NAD HEENT: normocephalic, anicteric Thyroid: no enlargement, no palpable nodules Pulmonary: No increased work of breathing, CTAB Cardiovascular: RRR, distal pulses 2+ Abdomen: NABS, soft, non-tender, non-distended.  Umbilicus without lesions.  No hepatomegaly, splenomegaly or masses palpable. No evidence of hernia  Genitourinary:  External: Normal external female genitalia.  Normal urethral meatus, normal  Bartholin's and Skene's glands.    Vagina: Normal vaginal mucosa, no evidence of prolapse.    Cervix: Grossly normal in appearance, no bleeding, no CMT  Uterus: Enlarged, mobile, normal contour.    Adnexa: ovaries non-enlarged, no adnexal masses  Rectal: deferred Extremities: no edema, erythema, or tenderness Neurologic: Grossly  intact Psychiatric: mood appropriate, affect full   Assessment: 22 y.o. G1P0 at [redacted]w[redacted]d presenting to initiate prenatal care  Plan: 1) Avoid alcoholic beverages. 2) Patient encouraged not to smoke.  3) Discontinue the use of all non-medicinal drugs and chemicals.  4) Take prenatal vitamins daily.  5) Nutrition, food safety (fish, cheese advisories, and high nitrite foods) and exercise discussed. 6) Hospital and practice style discussed with cross coverage system.  7) Genetic Screening, such as with 1st Trimester Screening, cell free fetal DNA, AFP testing, and Ultrasound, as well as with amniocentesis and CVS as appropriate, is discussed with patient. At the conclusion of today's visit patient requested cell free DNA genetic testing 8) Patient is asked about travel to areas at risk for the Zika virus, and counseled to avoid travel and exposure to mosquitoes or sexual partners who may have themselves been exposed to the virus. Testing is discussed, and will be ordered as appropriate.    Tresea Mall, CNM [facility], [facility] 08/12/2017, 2:57 PM

## 2017-08-13 LAB — CERVICOVAGINAL ANCILLARY ONLY
CHLAMYDIA, DNA PROBE: NEGATIVE
Neisseria Gonorrhea: NEGATIVE
Trichomonas: NEGATIVE

## 2017-08-13 LAB — URINE CULTURE

## 2017-08-21 ENCOUNTER — Other Ambulatory Visit: Payer: Self-pay

## 2017-08-21 ENCOUNTER — Emergency Department
Admission: EM | Admit: 2017-08-21 | Discharge: 2017-08-21 | Disposition: A | Discharge status: Home / Self Care | Payer: Medicaid Other | Attending: Emergency Medicine | Admitting: Emergency Medicine

## 2017-08-21 ENCOUNTER — Encounter: Payer: Self-pay | Admitting: Emergency Medicine

## 2017-08-21 DIAGNOSIS — R55 Syncope and collapse: Secondary | ICD-10-CM

## 2017-08-21 DIAGNOSIS — E860 Dehydration: Secondary | ICD-10-CM | POA: Diagnosis not present

## 2017-08-21 DIAGNOSIS — Z87891 Personal history of nicotine dependence: Secondary | ICD-10-CM | POA: Diagnosis not present

## 2017-08-21 DIAGNOSIS — Z79899 Other long term (current) drug therapy: Secondary | ICD-10-CM | POA: Diagnosis not present

## 2017-08-21 LAB — URINALYSIS, COMPLETE (UACMP) WITH MICROSCOPIC
BILIRUBIN URINE: NEGATIVE
Bacteria, UA: NONE SEEN
GLUCOSE, UA: NEGATIVE mg/dL
Hgb urine dipstick: NEGATIVE
Ketones, ur: 5 mg/dL — AB
LEUKOCYTES UA: NEGATIVE
NITRITE: NEGATIVE
PH: 7 (ref 5.0–8.0)
PROTEIN: NEGATIVE mg/dL
Specific Gravity, Urine: 1.021 (ref 1.005–1.030)

## 2017-08-21 LAB — CBC
HCT: 36.5 % (ref 35.0–47.0)
Hemoglobin: 12.9 g/dL (ref 12.0–16.0)
MCH: 30.1 pg (ref 26.0–34.0)
MCHC: 35.3 g/dL (ref 32.0–36.0)
MCV: 85.1 fL (ref 80.0–100.0)
PLATELETS: 334 10*3/uL (ref 150–440)
RBC: 4.3 MIL/uL (ref 3.80–5.20)
RDW: 12.8 % (ref 11.5–14.5)
WBC: 10.4 10*3/uL (ref 3.6–11.0)

## 2017-08-21 LAB — BASIC METABOLIC PANEL
Anion gap: 11 (ref 5–15)
BUN: 7 mg/dL (ref 6–20)
CHLORIDE: 105 mmol/L (ref 98–111)
CO2: 18 mmol/L — ABNORMAL LOW (ref 22–32)
CREATININE: 0.67 mg/dL (ref 0.44–1.00)
Calcium: 9 mg/dL (ref 8.9–10.3)
GFR calc non Af Amer: >60 mL/min (ref >60)
Glucose, Bld: 89 mg/dL (ref 70–99)
Potassium: 3.4 mmol/L — ABNORMAL LOW (ref 3.5–5.1)
SODIUM: 134 mmol/L — AB (ref 135–145)

## 2017-08-21 MED ORDER — SODIUM CHLORIDE 0.9 % IV BOLUS
1000.0000 mL | Freq: Once | INTRAVENOUS | Status: AC
  Administered 2017-08-21: 1000 mL via INTRAVENOUS

## 2017-08-21 NOTE — ED Triage Notes (Signed)
Pt arrives from work via [facility]. C/O N/V and near syncopal episode. Pt is approx [redacted] weeks pregnant, denies any vag bleeding. Prot initiated

## 2017-08-21 NOTE — ED Provider Notes (Signed)
[facility] Emergency Department Provider Note  ____________________________________________  Time seen: Approximately 11:11 AM  I have reviewed the triage vital signs and the nursing notes.   HISTORY  Chief Complaint Near Syncope   HPI Stacey Donovan is a 22 y.o. female G1P0 at [redacted] weeks GA who presents for evaluation of syncopal event.  Patient reports that she was in her usual state of health this morning when she went to work.  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  She reports that he was really hot in the house while she was helping clean up.  She started feeling hot and dizzy.  She had a syncopal episode lasting a few seconds.  She was assisted by a coworker and did not fall during this episode.  She did not sustain any injuries.  After waking up she continued to report that she was feeling hot and nauseated.  She then had several episodes of nonbloody nonbilious emesis.  Patient reports that she is still feeling slightly lightheaded at this time.  She denies vaginal bleeding, vaginal discharge, dysuria, abdominal pain, chest pain, shortness of breath, leg swelling, diarrhea, fever.  She has had mild nausea during pregnancy but has been taking promethazine with good relief.  She denies any family history of sudden death or congenital deafness, or blood clots.  No recent travel immobilization.  Past Medical History:  Diagnosis Date  . Acute appendicitis with generalized peritonitis   . Appendicitis 09/04/2016  . Hypoglycemia     Patient Active Problem List   Diagnosis Date Noted  . Supervision of high risk pregnancy, antepartum 08/11/2017  . Appendicitis 09/04/2016  . Acute appendicitis with generalized peritonitis     Past Surgical History:  Procedure Laterality Date  . LAPAROSCOPIC APPENDECTOMY N/A 09/04/2016   Procedure: APPENDECTOMY LAPAROSCOPIC;  Surgeon: Ricarda Frame, MD;  Location: [facility] ORS;  Service: General;  Laterality: N/A;     Prior to Admission medications   Medication Sig Start Date End Date Taking? Authorizing Provider  Doxylamine-Pyridoxine ER (BONJESTA) 20-20 MG TBCR Take 1 tablet by mouth 2 (two) times daily. Sig 1 tab po  daily at bedtime, 1 tab po daily morning 08/07/17 10/06/17  Natale Milch, MD  promethazine (PHENERGAN) 25 MG tablet Take 1 tablet (25 mg total) by mouth every 6 (six) hours as needed for nausea or vomiting. 08/07/17   Schuman, Christanna R, MD  sertraline (ZOLOFT) 50 MG tablet Take 1 tablet (50 mg total) by mouth daily. 08/07/17   Natale Milch, MD    Allergies Patient has no known allergies.  History reviewed. No pertinent family history.  Social History Social History   Tobacco Use  . Smoking status: Former Smoker    Packs/day: 1.00    Types: Cigarettes  . Smokeless tobacco: Never Used  Substance Use Topics  . Alcohol use: No  . Drug use: Not Currently    Types: Marijuana    Review of Systems  Constitutional: Negative for fever. + syncope Eyes: Negative for visual changes. ENT: Negative for sore throat. Neck: No neck pain  Cardiovascular: Negative for chest pain. Respiratory: Negative for shortness of breath. Gastrointestinal: Negative for abdominal pain,  Diarrhea. + vomiting Genitourinary: Negative for dysuria. Musculoskeletal: Negative for back pain. Skin: Negative for rash. Neurological: Negative for headaches, weakness or numbness. Psych: No SI or HI  ____________________________________________   PHYSICAL EXAM:  VITAL SIGNS: ED Triage Vitals   Vitals:   08/21/17 1200 08/21/17 1300  BP: 97/61 Marland Kitchen)  100/51  Pulse: 86 80  Resp: (!) 23 (!) 25  SpO2: 100% 100%    Constitutional: Alert and oriented. Well appearing and in no apparent distress. HEENT:      Head: Normocephalic and atraumatic.         Eyes: Conjunctivae are normal. Sclera is non-icteric.       Mouth/Throat: Mucous membranes are moist.       Neck: Supple with no signs of  meningismus. Cardiovascular: Regular rate and rhythm. No murmurs, gallops, or rubs. 2+ symmetrical distal pulses are present in all extremities. No JVD. Respiratory: Normal respiratory effort. Lungs are clear to auscultation bilaterally. No wheezes, crackles, or rhonchi.  Gastrointestinal: Soft, non tender, and non distended with positive bowel sounds. No rebound or guarding. Musculoskeletal: Nontender with normal range of motion in all extremities. No edema, cyanosis, or erythema of extremities. Neurologic: Normal speech and language. Face is symmetric. Moving all extremities. No gross focal neurologic deficits are appreciated. Skin: Skin is warm, dry and intact. No rash noted. Psychiatric: Mood and affect are normal. Speech and behavior are normal.  ____________________________________________   LABS (all labs ordered are listed, but only abnormal results are displayed)  Labs Reviewed  BASIC METABOLIC PANEL - Abnormal; Notable for the following components:      Result Value   Sodium 134 (*)    Potassium 3.4 (*)    CO2 18 (*)    All other components within normal limits  URINALYSIS, COMPLETE (UACMP) WITH MICROSCOPIC - Abnormal; Notable for the following components:   Color, Urine YELLOW (*)    APPearance HAZY (*)    Ketones, ur 5 (*)    All other components within normal limits  CBC   ____________________________________________  EKG  ED ECG REPORT I, Nita Sicklearolina Renelle Stegenga, the attending physician, personally viewed and interpreted this ECG.  Normal sinus rhythm, normal intervals, normal axis, no STE or depressions, no evidence of HOCM, AV block, delta wave, ARVD, prolonged QTc, WPW, or Brugada.  ____________________________________________  RADIOLOGY  none  ____________________________________________   PROCEDURES  Procedure(s) performed: None Procedures Critical Care performed:  None ____________________________________________   INITIAL IMPRESSION / ASSESSMENT AND  PLAN / ED COURSE   22 y.o. female G1P0 at [redacted] weeks GA who presents for evaluation of syncopal event.  Patient is extremely well-appearing, no distress, she has normal vital signs, neurologically intact, heart regular rate and rhythm with no murmurs, there is no pitting edema of her extremities, no evidence of trauma based on history and physical exam.  EKG showing no evidence of dysrhythmias or ischemia.  Most likely a vasovagal episode in the setting of feeling overheated.  No chest pain, no shortness of breath, no tachypnea, no tachycardia, no hypoxia, no leg swelling, no clinical signs or symptoms of PE.  Will give IV fluids and check basic labs for any signs of dehydration, anemia, acute kidney injury, or UTI.  Will monitor patient on telemetry for any evidence of dysrhythmias.    _________________________ 1:14 PM on 08/21/2017 -----------------------------------------  Urine with mild ketones, labs are otherwise within normal limits.  Presentation concerning for mild dehydration plus pregnancy causing syncopal event.  Patient feels back to her baseline after liter of IV fluids.  Her vitals are within normal limits.  Review of telemetry strip shows no dysrhythmias.  At this time she is stable for discharge.  Discussed increase oral hydration and close follow-up with OB/GYN.  Discussed return precautions for any further episodes of syncope, vaginal bleeding, chest pain or shortness  of breath or leg swelling   As part of my medical decision making, I reviewed the following data within the electronic MEDICAL RECORD NUMBER Nursing notes reviewed and incorporated, Labs reviewed , EKG interpreted , Old chart reviewed, Notes from prior ED visits and [place] Controlled Substance Database    Pertinent labs & imaging results that were available during my care of the patient were reviewed by me and considered in my medical decision making (see chart for  details).    ____________________________________________   FINAL CLINICAL IMPRESSION(S) / ED DIAGNOSES  Final diagnoses:  Syncope, unspecified syncope type  Dehydration      NEW MEDICATIONS STARTED DURING THIS VISIT:  ED Discharge Orders    None       Note:  This document was prepared using Dragon voice recognition software and may include unintentional dictation errors.    Don Perking, [place], MD 08/21/17 1316

## 2017-08-22 ENCOUNTER — Other Ambulatory Visit: Payer: Self-pay

## 2017-08-22 ENCOUNTER — Emergency Department
Admission: EM | Admit: 2017-08-22 | Discharge: 2017-08-22 | Disposition: A | Discharge status: Home / Self Care | Payer: Medicaid Other | Attending: Emergency Medicine | Admitting: Emergency Medicine

## 2017-08-22 ENCOUNTER — Encounter: Payer: Self-pay | Admitting: Emergency Medicine

## 2017-08-22 DIAGNOSIS — Y929 Unspecified place or not applicable: Secondary | ICD-10-CM | POA: Insufficient documentation

## 2017-08-22 DIAGNOSIS — W25XXXA Contact with sharp glass, initial encounter: Secondary | ICD-10-CM | POA: Diagnosis not present

## 2017-08-22 DIAGNOSIS — S51811A Laceration without foreign body of right forearm, initial encounter: Secondary | ICD-10-CM | POA: Insufficient documentation

## 2017-08-22 DIAGNOSIS — Z3A01 Less than 8 weeks gestation of pregnancy: Secondary | ICD-10-CM | POA: Diagnosis not present

## 2017-08-22 DIAGNOSIS — Y9389 Activity, other specified: Secondary | ICD-10-CM | POA: Insufficient documentation

## 2017-08-22 DIAGNOSIS — O9A211 Injury, poisoning and certain other consequences of external causes complicating pregnancy, first trimester: Secondary | ICD-10-CM | POA: Insufficient documentation

## 2017-08-22 DIAGNOSIS — Z87891 Personal history of nicotine dependence: Secondary | ICD-10-CM | POA: Diagnosis not present

## 2017-08-22 DIAGNOSIS — Y998 Other external cause status: Secondary | ICD-10-CM | POA: Insufficient documentation

## 2017-08-22 MED ORDER — LIDOCAINE HCL 1 % IJ SOLN: 5.0000 mL | Freq: Once | INTRAMUSCULAR | Status: DC

## 2017-08-22 MED ORDER — LIDOCAINE HCL (PF) 1 % IJ SOLN
INTRAMUSCULAR | Status: AC
  Filled 2017-08-22: qty 5

## 2017-08-22 NOTE — ED Triage Notes (Signed)
approx 30 min prior to arrival tripped and fell into glass cutting R forearm, small lac with no bleeding at present.

## 2017-08-22 NOTE — ED Provider Notes (Signed)
[facility] Emergency Department Provider Note  ____________________________________________  Time seen: Approximately 9:06 PM  I have reviewed the triage vital signs and the nursing notes.   HISTORY  Chief Complaint Laceration    HPI Stacey Donovan is a 22 y.o. female presents to the emergency department with a triangular-shaped superficial right forearm laceration after patient fell into a glass window.  Patient has other abrasions along right forearm.  Patient reports that she is currently [redacted] weeks pregnant.  She denies vaginal bleeding, pelvic pain or abdominal pain.   Past Medical History:  Diagnosis Date  . Acute appendicitis with generalized peritonitis   . Appendicitis 09/04/2016  . Hypoglycemia     Patient Active Problem List   Diagnosis Date Noted  . Supervision of high risk pregnancy, antepartum 08/11/2017  . Appendicitis 09/04/2016  . Acute appendicitis with generalized peritonitis     Past Surgical History:  Procedure Laterality Date  . LAPAROSCOPIC APPENDECTOMY N/A 09/04/2016   Procedure: APPENDECTOMY LAPAROSCOPIC;  Surgeon: Ricarda Frame, MD;  Location: [facility] ORS;  Service: General;  Laterality: N/A;    Prior to Admission medications   Medication Sig Start Date End Date Taking? Authorizing Provider  Doxylamine-Pyridoxine ER (BONJESTA) 20-20 MG TBCR Take 1 tablet by mouth 2 (two) times daily. Sig 1 tab po  daily at bedtime, 1 tab po daily morning 08/07/17 10/06/17  Natale Milch, MD  promethazine (PHENERGAN) 25 MG tablet Take 1 tablet (25 mg total) by mouth every 6 (six) hours as needed for nausea or vomiting. 08/07/17   Schuman, Christanna R, MD  sertraline (ZOLOFT) 50 MG tablet Take 1 tablet (50 mg total) by mouth daily. 08/07/17   Natale Milch, MD    Allergies Patient has no known allergies.  No family history on file.  Social History Social History   Tobacco Use  . Smoking status: Former Smoker   Packs/day: 1.00    Types: Cigarettes  . Smokeless tobacco: Never Used  Substance Use Topics  . Alcohol use: No  . Drug use: Not Currently    Types: Marijuana     Review of Systems  Constitutional: No fever/chills Eyes: No visual changes. No discharge ENT: No upper respiratory complaints. Cardiovascular: no chest pain. Respiratory: no cough. No SOB. Gastrointestinal: No abdominal pain.  No nausea, no vomiting.  No diarrhea.  No constipation. Genitourinary: Negative for dysuria. No hematuria Musculoskeletal: Negative for musculoskeletal pain. Skin: Patient has right forearm laceration.  Neurological: Negative for headaches, focal weakness or numbness.   ____________________________________________   PHYSICAL EXAM:  VITAL SIGNS: ED Triage Vitals  Enc Vitals Group     BP 08/22/17 1802 120/74     Pulse Rate 08/22/17 1802 (!) 102     Resp 08/22/17 1802 20     Temp 08/22/17 1802 98.3 F (36.8 C)     Temp Source 08/22/17 1802 Oral     SpO2 08/22/17 1802 97 %     Weight 08/22/17 1803 120 lb (54.4 kg)     Height 08/22/17 1803 5\' 1"  (1.549 m)     Head Circumference --      Peak Flow --      Pain Score 08/22/17 1803 10     Pain Loc --      Pain Edu? --      Excl. in GC? --      Constitutional: Alert and oriented. Well appearing and in no acute distress. Eyes: Conjunctivae are normal. PERRL. EOMI. Head: Atraumatic. Cardiovascular: Normal  rate, regular rhythm. Normal S1 and S2.  Good peripheral circulation. Respiratory: Normal respiratory effort without tachypnea or retractions. Lungs CTAB. Good air entry to the bases with no decreased or absent breath sounds. Gastrointestinal: Bowel sounds 4 quadrants. Soft and nontender to palpation. No guarding or rigidity. No palpable masses. No distention. No CVA tenderness. Musculoskeletal: Full range of motion to all extremities. No gross deformities appreciated. Neurologic:  Normal speech and language. No gross focal neurologic  deficits are appreciated.  Skin: Patient has a 2 cm, triangular-shaped laceration along right forearm deep to dermis. Psychiatric: Mood and affect are normal. Speech and behavior are normal. Patient exhibits appropriate insight and judgement.   ____________________________________________   LABS (all labs ordered are listed, but only abnormal results are displayed)  Labs Reviewed - No data to display ____________________________________________  EKG   ____________________________________________  RADIOLOGY   No results found.  ____________________________________________    PROCEDURES  Procedure(s) performed:    Procedures  LACERATION REPAIR Performed by: Orvil FeilJaclyn M Blythe Veach Authorized by: Orvil FeilJaclyn M Kmari Halter Consent: Verbal consent obtained. Risks and benefits: risks, benefits and alternatives were discussed Consent given by: patient Patient identity confirmed: provided demographic data Prepped and Draped in normal sterile fashion Wound explored  Laceration Location: Right forearm   Laceration Length: 2 cm  No Foreign Bodies seen or palpated  Anesthesia: local infiltration  Local anesthetic: lidocaine 1% without epinephrine  Anesthetic total: 3 ml  Irrigation method: syringe Amount of cleaning: standard  Skin closure: 4-0 Ethilon   Number of sutures: 5  Technique: Simple Interrupted   Patient tolerance: Patient tolerated the procedure well with no immediate complications.   Medications  lidocaine (XYLOCAINE) 1 % (with pres) injection 5 mL (has no administration in time range)  lidocaine (PF) (XYLOCAINE) 1 % injection (has no administration in time range)     ____________________________________________   INITIAL IMPRESSION / ASSESSMENT AND PLAN / ED COURSE  Pertinent labs & imaging results that were available during my care of the patient were reviewed by me and considered in my medical decision making (see chart for details).  Review of the   [place]RS was performed in accordance of the [facility] prior to dispensing any controlled drugs.      Assessment and plan Laceration repair Patient presents to the emergency department with a 2 cm right forearm laceration after patient fell into a window.  Laceration is very superficial in nature and further work-up with x-ray is not warranted at this time.  Patient was advised to have sutures removed in 1 week.  She was advised to follow-up with primary care as needed.  All patient questions were answered.     ____________________________________________  FINAL CLINICAL IMPRESSION(S) / ED DIAGNOSES  Final diagnoses:  Laceration of right forearm, initial encounter      NEW MEDICATIONS STARTED DURING THIS VISIT:  ED Discharge Orders    None          This chart was dictated using voice recognition software/Dragon. Despite best efforts to proofread, errors can occur which can change the meaning. Any change was purely unintentional.    Gasper LloydWoods, Keevin Panebianco M, PA-C 08/22/17 2112    Sharyn CreamerQuale, Mark, MD 08/23/17 [id number]

## 2017-08-22 NOTE — ED Notes (Signed)
See triage note states she tripped going up steps and right arm went thur glass  Laceration noted to right forearm

## 2017-09-08 ENCOUNTER — Ambulatory Visit (INDEPENDENT_AMBULATORY_CARE_PROVIDER_SITE_OTHER): Payer: Medicaid Other | Admitting: Obstetrics & Gynecology

## 2017-09-08 VITALS — BP 100/60 | Wt 116.0 lb

## 2017-09-08 DIAGNOSIS — O0991 Supervision of high risk pregnancy, unspecified, first trimester: Secondary | ICD-10-CM

## 2017-09-08 DIAGNOSIS — Z3A10 10 weeks gestation of pregnancy: Secondary | ICD-10-CM

## 2017-09-08 DIAGNOSIS — O0990 Supervision of high risk pregnancy, unspecified, unspecified trimester: Secondary | ICD-10-CM

## 2017-09-08 LAB — OB RESULTS CONSOLE VARICELLA ZOSTER ANTIBODY, IGG: Varicella: IMMUNE

## 2017-09-08 NOTE — Progress Notes (Signed)
  Subjective  Fetal Movement? no Contractions? no Leaking Fluid? no Vaginal Bleeding? no Min nausea Objective  BP 100/60   Wt 116 lb (52.6 kg)   LMP 06/21/2017 (Approximate)   BMI 21.92 kg/m  General: NAD Pumonary: no increased work of breathing Abdomen: gravid, non-tender Extremities: no edema Psychiatric: mood appropriate, affect full  Assessment  21 y.o. G1P0 at 3525w3d by  04/03/2018, by Ultrasound presenting for routine prenatal visit  Plan   Problem List Items Addressed This Visit      Other   Supervision of high risk pregnancy, antepartum   Relevant Orders   MaterniT21 PLUS Core+SCA   RPR+Rh+ABO+Rub Ab+Ab Scr+CB...    Other Visit Diagnoses    [redacted] weeks gestation of pregnancy    -  Primary   Relevant Orders   MaterniT21 PLUS Core+SCA   RPR+Rh+ABO+Rub Ab+Ab Scr+CB...    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

## 2017-09-08 NOTE — Patient Instructions (Signed)

## 2017-09-09 LAB — RPR+RH+ABO+RUB AB+AB SCR+CB...
ANTIBODY SCREEN: NEGATIVE
HEMOGLOBIN: 12.8 g/dL (ref 11.1–15.9)
HIV SCREEN 4TH GENERATION: NONREACTIVE
Hematocrit: 36.8 % (ref 34.0–46.6)
Hepatitis B Surface Ag: NEGATIVE
MCH: 30.4 pg (ref 26.6–33.0)
MCHC: 34.8 g/dL (ref 31.5–35.7)
MCV: 87 fL (ref 79–97)
Platelets: 414 10*3/uL (ref 150–450)
RBC: 4.21 x10E6/uL (ref 3.77–5.28)
RDW: 12.7 % (ref 12.3–15.4)
RH TYPE: POSITIVE
RPR: NONREACTIVE
RUBELLA: 1.52 {index} (ref >0.99)
VARICELLA: 209 {index} (ref >165)
WBC: 10.2 10*3/uL (ref 3.4–10.8)

## 2017-09-12 LAB — MATERNIT21 PLUS CORE+SCA
CHROMOSOME 18: NEGATIVE
Chromosome 13: NEGATIVE
Chromosome 21: NEGATIVE
Y CHROMOSOME: NOT DETECTED

## 2017-09-16 ENCOUNTER — Other Ambulatory Visit: Payer: Self-pay | Admitting: Maternal Newborn

## 2017-10-07 ENCOUNTER — Ambulatory Visit (INDEPENDENT_AMBULATORY_CARE_PROVIDER_SITE_OTHER): Payer: Medicaid Other | Admitting: Maternal Newborn

## 2017-10-07 VITALS — BP 100/60 | Wt 122.2 lb

## 2017-10-07 DIAGNOSIS — Z3A14 14 weeks gestation of pregnancy: Secondary | ICD-10-CM

## 2017-10-07 DIAGNOSIS — Z3689 Encounter for other specified antenatal screening: Secondary | ICD-10-CM

## 2017-10-07 DIAGNOSIS — Z3402 Encounter for supervision of normal first pregnancy, second trimester: Secondary | ICD-10-CM

## 2017-10-07 DIAGNOSIS — O0990 Supervision of high risk pregnancy, unspecified, unspecified trimester: Secondary | ICD-10-CM

## 2017-10-07 LAB — POCT URINALYSIS DIPSTICK OB
GLUCOSE, UA: NEGATIVE
POC,PROTEIN,UA: NEGATIVE

## 2017-10-07 NOTE — Patient Instructions (Signed)

## 2017-10-07 NOTE — Progress Notes (Signed)
    Routine Prenatal Care Visit  Subjective  Stacey Donovan is a 22 y.o. G1P0 at 7655w4d being seen today for ongoing prenatal care.  She is currently monitored for the following issues for this high-risk pregnancy and has Acute appendicitis with generalized peritonitis; Appendicitis; and Supervision of high risk pregnancy, antepartum on their problem list.  ----------------------------------------------------------------------------------- Patient reports sore throat and runny nose.   Vag. Bleeding: None.  ----------------------------------------------------------------------------------- The following portions of the patient's history were reviewed and updated as appropriate: allergies, current medications, past family history, past medical history, past social history, past surgical history and problem list. Problem list updated.  Objective  Blood pressure 100/60, weight 122 lb 4 oz (55.5 kg), last menstrual period 06/21/2017. Pregravid weight 109 lb (49.4 kg) Total Weight Gain 13 lb 4 oz (6.01 kg) Body mass index is 23.1 kg/m.   Urinalysis: Protein Negative, Glucose Negative Fetal Status: Fetal Heart Rate (bpm): 163         General:  Alert, oriented and cooperative. Patient is in no acute distress.  Skin: Skin is warm and dry. No rash noted.   Cardiovascular: Normal heart rate noted  Respiratory: Normal respiratory effort, no problems with respiration noted  Abdomen: Soft, gravid, appropriate for gestational age. Pain/Pressure: Absent     Pelvic:  Cervical exam deferred        Extremities: Normal range of motion.     Mental Status: Normal mood and affect. Normal behavior. Normal judgment and thought content.    Assessment   21 y.o. G1P0 at 7055w4d, EDD 04/03/2018 by Ultrasound presenting for a routine prenatal visit.  Plan   Pregnancy#1 Problems (from 06/21/17 to present)    No problems associated with this episode.      Discussed OTC medication and rest/fluids for viral  upper respiratory infection.  Please refer to After Visit Summary for other counseling recommendations.   Return in about 4 weeks (around 11/04/2017) for ROB and anatomy scan.  Stacey BruinsJacelyn Taelyn Donovan, CNM 10/07/2017  1:55 PM

## 2017-10-07 NOTE — Progress Notes (Signed)
C/o cold sxs - what to take - safe meds given.rj

## 2017-11-04 ENCOUNTER — Encounter: Payer: Self-pay | Admitting: Maternal Newborn

## 2017-11-04 ENCOUNTER — Ambulatory Visit (INDEPENDENT_AMBULATORY_CARE_PROVIDER_SITE_OTHER): Payer: Medicaid Other | Admitting: Maternal Newborn

## 2017-11-04 ENCOUNTER — Ambulatory Visit (INDEPENDENT_AMBULATORY_CARE_PROVIDER_SITE_OTHER): Payer: Medicaid Other

## 2017-11-04 VITALS — BP 110/66 | Wt 126.0 lb

## 2017-11-04 DIAGNOSIS — O0990 Supervision of high risk pregnancy, unspecified, unspecified trimester: Secondary | ICD-10-CM

## 2017-11-04 DIAGNOSIS — Z3A18 18 weeks gestation of pregnancy: Secondary | ICD-10-CM

## 2017-11-04 DIAGNOSIS — Z363 Encounter for antenatal screening for malformations: Secondary | ICD-10-CM | POA: Diagnosis not present

## 2017-11-04 DIAGNOSIS — K5900 Constipation, unspecified: Secondary | ICD-10-CM

## 2017-11-04 DIAGNOSIS — Z3689 Encounter for other specified antenatal screening: Secondary | ICD-10-CM

## 2017-11-04 DIAGNOSIS — O219 Vomiting of pregnancy, unspecified: Secondary | ICD-10-CM

## 2017-11-04 DIAGNOSIS — O99612 Diseases of the digestive system complicating pregnancy, second trimester: Secondary | ICD-10-CM

## 2017-11-04 MED ORDER — DOCUSATE SODIUM 100 MG PO CAPS: 100.0000 mg | ORAL_CAPSULE | Freq: Two times a day (BID) | ORAL | 2 refills | Status: DC | PRN

## 2017-11-04 MED ORDER — PROMETHAZINE HCL 25 MG PO TABS
25.0000 mg | ORAL_TABLET | Freq: Four times a day (QID) | ORAL | 2 refills | Status: DC | PRN
Start: 2017-11-04 — End: 2018-02-22

## 2017-11-04 NOTE — Progress Notes (Signed)
    Routine Prenatal Care Visit  Subjective  Tomma Ehinger Robicheaux is a 22 y.o. G1P0 at [redacted]w[redacted]d being seen today for ongoing prenatal care.  She is currently monitored for the following issues for this high-risk pregnancy and has Supervision of high risk pregnancy, antepartum on their problem list.  ----------------------------------------------------------------------------------- Patient reports nausea and vomiting. She has been able to keep food down, would like a refill on Phenergan to help if it continues. She also reports constipation. Vag. Bleeding: None.  Movement: Present. No leaking of fluid.  ----------------------------------------------------------------------------------- The following portions of the patient's history were reviewed and updated as appropriate: allergies, current medications, past family history, past medical history, past social history, past surgical history and problem list. Problem list updated.  Objective  Blood pressure 110/66, weight 126 lb (57.2 kg), last menstrual period 06/21/2017. Pregravid weight 109 lb (49.4 kg) Total Weight Gain 17 lb (7.711 kg) Body mass index is 23.81 kg/m. Urinalysis: No sample today Fetal Status: Fetal Heart Rate (bpm): 151   Movement: Present     General:  Alert, oriented and cooperative. Patient is in no acute distress.  Skin: Skin is warm and dry. No rash noted.   Cardiovascular: Normal heart rate noted  Respiratory: Normal respiratory effort, no problems with respiration noted  Abdomen: Soft, gravid, appropriate for gestational age. Pain/Pressure: Absent     Pelvic:  Cervical exam deferred        Extremities: Normal range of motion.     Mental Status: Normal mood and affect. Normal behavior. Normal judgment and thought content.     Assessment   22 y.o. G1P0 at 100w4d, EDD 04/03/2018 by Ultrasound presenting for a routine prenatal visit.  Plan   Pregnancy#1 Problems (from 06/21/17 to present)    Problem Noted Resolved     Supervision of high risk pregnancy, antepartum 08/11/2017 by Tresea Mall, CNM No   Overview Addendum 10/07/2017  1:45 PM by Oswaldo Conroy, CNM    Clinic [facility] Prenatal Labs  Dating [place] Blood type:A POS(06/29 [phone])   Genetic Screen NIPS: Antibody:Negative (07/30 1346)  Anatomic [place]  Rubella: 1.52 (07/30 1346) Varicella: Immune  GTT Third trimester:  RPR: Non Reactive (07/30 1346)   Rhogam  HBsAg: Negative (07/30 1346)   TDaP vaccine                       Flu Shot: HIV: Non Reactive (07/30 1346)   Baby Food                                GBS:   Contraception  Pap:  CBB     CS/VBAC    Support Person Boyfriend Physicist, medical scan complete and normal today.   Discussed OTC remedies and dietary changes to help with constipation. Rx for Colace.  Brief discussion of pain management in labor and brochure given for doula program.  Please refer to After Visit Summary for other counseling recommendations.   Return in about 4 weeks (around 12/02/2017) for ROB .  Marcelyn Bruins, CNM 11/04/2017  2:33 PM

## 2017-11-04 NOTE — Patient Instructions (Signed)

## 2017-11-04 NOTE — Progress Notes (Signed)
ROB Anatomy scan today/ It is a GIRL! Nausea/ vomiting refill phenergan

## 2017-12-01 ENCOUNTER — Ambulatory Visit (INDEPENDENT_AMBULATORY_CARE_PROVIDER_SITE_OTHER): Payer: Medicaid Other | Admitting: Maternal Newborn

## 2017-12-01 ENCOUNTER — Encounter: Payer: Self-pay | Admitting: Maternal Newborn

## 2017-12-01 VITALS — BP 120/76 | Wt 140.0 lb

## 2017-12-01 DIAGNOSIS — Z23 Encounter for immunization: Secondary | ICD-10-CM | POA: Diagnosis not present

## 2017-12-01 DIAGNOSIS — O0990 Supervision of high risk pregnancy, unspecified, unspecified trimester: Secondary | ICD-10-CM

## 2017-12-01 DIAGNOSIS — Z3A22 22 weeks gestation of pregnancy: Secondary | ICD-10-CM

## 2017-12-01 LAB — POCT URINALYSIS DIPSTICK OB
Glucose, UA: NEGATIVE
PROTEIN: NEGATIVE

## 2017-12-01 NOTE — Progress Notes (Signed)
ROB Flu vaccine   

## 2017-12-01 NOTE — Patient Instructions (Signed)

## 2017-12-01 NOTE — Progress Notes (Signed)
    Routine Prenatal Care Visit  Subjective  Stacey Donovan is a 22 y.o. G1P0 at [redacted]w[redacted]d being seen today for ongoing prenatal care.  She is currently monitored for the following issues for this high-risk pregnancy and has Supervision of high risk pregnancy, antepartum on their problem list.  ----------------------------------------------------------------------------------- Patient reports nausea in the nighttime, after eating large meals. Phenergan is helping. Contractions: Not present. Vag. Bleeding: None.  Movement: Present. No leaking of fluid.  ----------------------------------------------------------------------------------- The following portions of the patient's history were reviewed and updated as appropriate: allergies, current medications, past family history, past medical history, past social history, past surgical history and problem list. Problem list updated.  Objective  Blood pressure 120/76, weight 140 lb (63.5 kg), last menstrual period 06/21/2017. Pregravid weight 109 lb (49.4 kg) Total Weight Gain 31 lb (14.1 kg) Body mass index is 26.45 kg/m.   Urinalysis: Protein Negative, Glucose Negative Fetal Status: Fetal Heart Rate (bpm): 150 Fundal Height: 24 cm Movement: Present     General:  Alert, oriented and cooperative. Patient is in no acute distress.  Skin: Skin is warm and dry. No rash noted.   Cardiovascular: Normal heart rate noted  Respiratory: Normal respiratory effort, no problems with respiration noted  Abdomen: Soft, gravid, appropriate for gestational age. Pain/Pressure: Absent     Pelvic:  Cervical exam deferred        Extremities: Normal range of motion.  Edema: None  Mental Status: Normal mood and affect. Normal behavior. Normal judgment and thought content.     Assessment   22 y.o. G1P0 at [redacted]w[redacted]d, EDD 04/03/2018 by Ultrasound presenting for a routine prenatal visit.  Plan   Pregnancy#1 Problems (from 06/21/17 to present)    Problem Noted  Resolved   Supervision of high risk pregnancy, antepartum 08/11/2017 by Tresea Mall, CNM No   Overview Addendum 10/07/2017  1:45 PM by Oswaldo Conroy, CNM    Clinic [facility] Prenatal Labs  Dating [place] Blood type:A POS(06/29 [phone])   Genetic Screen NIPS: Antibody:Negative (07/30 1346)  Anatomic [place]  Rubella: 1.52 (07/30 1346) Varicella: Immune  GTT Third trimester:  RPR: Non Reactive (07/30 1346)   Rhogam  HBsAg: Negative (07/30 1346)   TDaP vaccine                       Flu Shot: HIV: Non Reactive (07/30 1346)   Baby Food                                GBS:   Contraception  Pap:  CBB     CS/VBAC    Support Person Boyfriend Apolinar Junes              Discussed weight gain and healthy eating during pregnancy. Will obtain growth scan with 28 week labs (will schedule for a 2 week appointment following next visit at 26 weeks).  Return in about 4 weeks (around 12/29/2017) for ROB.  Marcelyn Bruins, CNM 12/01/2017  4:07 PM

## 2017-12-29 ENCOUNTER — Ambulatory Visit (INDEPENDENT_AMBULATORY_CARE_PROVIDER_SITE_OTHER): Payer: Self-pay | Admitting: Maternal Newborn

## 2017-12-29 ENCOUNTER — Encounter: Payer: Self-pay | Admitting: Maternal Newborn

## 2017-12-29 VITALS — BP 100/70 | Wt 140.0 lb

## 2017-12-29 DIAGNOSIS — Z3689 Encounter for other specified antenatal screening: Secondary | ICD-10-CM

## 2017-12-29 DIAGNOSIS — M25559 Pain in unspecified hip: Secondary | ICD-10-CM

## 2017-12-29 DIAGNOSIS — O0990 Supervision of high risk pregnancy, unspecified, unspecified trimester: Secondary | ICD-10-CM

## 2017-12-29 DIAGNOSIS — O26899 Other specified pregnancy related conditions, unspecified trimester: Secondary | ICD-10-CM

## 2017-12-29 DIAGNOSIS — Z3A26 26 weeks gestation of pregnancy: Secondary | ICD-10-CM

## 2017-12-29 LAB — POCT URINALYSIS DIPSTICK OB
Glucose, UA: NEGATIVE
POC,PROTEIN,UA: NEGATIVE

## 2017-12-29 NOTE — Progress Notes (Signed)
Co left hip really bothers her - gives out, she falls to the floor, feels like it's rubbing together, painful.rj

## 2017-12-29 NOTE — Progress Notes (Signed)
    Routine Prenatal Care Visit  Subjective  Stacey MeekerBriana A Buford Donovan is a 22 y.o. G1P0 at 834w3d being seen today for ongoing prenatal care.  She is currently monitored for the following issues for this high-risk pregnancy and has Supervision of high risk pregnancy, antepartum on their problem list.  ----------------------------------------------------------------------------------- Patient reports left hip pain and a grinding sensation. Sometimes her hip pops on that side, and it feels like her leg will no longer support her weight. She has to grab onto something to avoid falling when this happens.   Contractions: Not present. Vag. Bleeding: None.  Movement: Present. No leaking of fluid.  ----------------------------------------------------------------------------------- The following portions of the patient's history were reviewed and updated as appropriate: allergies, current medications, past family history, past medical history, past social history, past surgical history and problem list. Problem list updated.  Objective  Blood pressure 100/70, weight 140 lb (63.5 kg), last menstrual period 06/21/2017. Pregravid weight 109 lb (49.4 kg) Total Weight Gain 31 lb (14.1 kg) Body mass index is 26.45 kg/m.   Urinalysis: Protein Negative, Glucose Negative  Fetal Status: Fetal Heart Rate (bpm): 154 Fundal Height: 29 cm Movement: Present     General:  Alert, oriented and cooperative. Patient is in no acute distress.  Skin: Skin is warm and dry. No rash noted.   Cardiovascular: Normal heart rate noted  Respiratory: Normal respiratory effort, no problems with respiration noted  Abdomen: Soft, gravid, appropriate for gestational age. Pain/Pressure: Present     Pelvic:  Cervical exam deferred        Extremities: Normal range of motion.  Edema: None  Mental Status: Normal mood and affect. Normal behavior. Normal judgment and thought content.    Assessment   22 y.o. G1P0 at 7434w3d, EDD 04/03/2018 by  Ultrasound presenting for a routine prenatal visit.  Plan   Pregnancy#1 Problems (from 06/21/17 to present)    Problem Noted Resolved   Supervision of high risk pregnancy, antepartum 08/11/2017 by Tresea MallGledhill, Jane, CNM No   Overview Addendum 10/07/2017  1:45 PM by Oswaldo ConroySchmid, Mina Carlisi Y, CNM    Clinic [facility] Prenatal Labs  Dating US Blood type:A POS(06/29 [phone])   Genetic Screen NIPS: Antibody:Negative (07/30 1346)  Anatomic US  Rubella: 1.52 (07/30 1346) Varicella: Immune  GTT Third trimester:  RPR: Non Reactive (07/30 1346)   Rhogam  HBsAg: Negative (07/30 1346)   TDaP vaccine                       Flu Shot: HIV: Non Reactive (07/30 1346)   Baby Food                                GBS:   Contraception  Pap:  CBB     CS/VBAC    Support Person Boyfriend Brandon            Referral to physical therapy for left hip pain and popping with feeling that she will fall.  Growth scan next time for fundal height measuring greater than dates for two consecutive appointments.  Return in about 2 weeks (around 01/12/2018) for ROB with GTT/labs/growth scan.  Marcelyn BruinsJacelyn Cherolyn Behrle, CNM 12/29/2017  2:41 PM

## 2018-01-05 ENCOUNTER — Ambulatory Visit: Payer: Medicaid Other

## 2018-01-12 ENCOUNTER — Ambulatory Visit (INDEPENDENT_AMBULATORY_CARE_PROVIDER_SITE_OTHER): Payer: Self-pay

## 2018-01-12 ENCOUNTER — Ambulatory Visit (INDEPENDENT_AMBULATORY_CARE_PROVIDER_SITE_OTHER): Payer: Medicaid Other | Admitting: Maternal Newborn

## 2018-01-12 ENCOUNTER — Encounter: Payer: Self-pay | Admitting: Maternal Newborn

## 2018-01-12 ENCOUNTER — Other Ambulatory Visit: Payer: Medicaid Other

## 2018-01-12 VITALS — BP 110/70 | Wt 143.0 lb

## 2018-01-12 DIAGNOSIS — O0990 Supervision of high risk pregnancy, unspecified, unspecified trimester: Secondary | ICD-10-CM

## 2018-01-12 DIAGNOSIS — Z362 Encounter for other antenatal screening follow-up: Secondary | ICD-10-CM

## 2018-01-12 DIAGNOSIS — Z3689 Encounter for other specified antenatal screening: Secondary | ICD-10-CM

## 2018-01-12 DIAGNOSIS — Z3A28 28 weeks gestation of pregnancy: Secondary | ICD-10-CM

## 2018-01-12 DIAGNOSIS — O0993 Supervision of high risk pregnancy, unspecified, third trimester: Secondary | ICD-10-CM

## 2018-01-12 NOTE — Progress Notes (Signed)
    Routine Prenatal Care Visit  Subjective  [name] is a 22 y.o. G1P0 at 5239w3d being seen today for ongoing prenatal care.  She is currently monitored for the following issues for this high-risk pregnancy and has Supervision of high risk pregnancy, antepartum and Pregnancy related hip pain, antepartum on their problem list.  ----------------------------------------------------------------------------------- Patient reports hip pain has improved some; she was not able to go to physical therapy because insurance would not cover a visit. She has been doing hip exercises which have helped.   Contractions: Not present. Vag. Bleeding: None.  Movement: Present. No leaking of fluid.  ----------------------------------------------------------------------------------- The following portions of the patient's history were reviewed and updated as appropriate: allergies, current medications, past family history, past medical history, past social history, past surgical history and problem list. Problem list updated.  Objective  Blood pressure 110/70, weight 143 lb (64.9 kg), last menstrual period 06/21/2017. Pregravid weight 109 lb (49.4 kg) Total Weight Gain 34 lb (15.4 kg) Body mass index is 27.02 kg/m.   Fetal Status: Fetal Heart Rate (bpm): 152   Movement: Present     General:  Alert, oriented and cooperative. Patient is in no acute distress.  Skin: Skin is warm and dry. No rash noted.   Cardiovascular: Normal heart rate noted  Respiratory: Normal respiratory effort, no problems with respiration noted  Abdomen: Soft, gravid, appropriate for gestational age. Pain/Pressure: Absent     Pelvic:  Cervical exam deferred        Extremities: Normal range of motion.  Edema: None  Mental Status: Normal mood and affect. Normal behavior. Normal judgment and thought content.    Assessment   22 y.o. G1P0 at 1039w3d, EDD 04/03/2018 by Ultrasound presenting for a routine prenatal visit.  Plan    Pregnancy#1 Problems (from 06/21/17 to present)    Problem Noted Resolved   Supervision of high risk pregnancy, antepartum 08/11/2017 by Tresea MallGledhill, Jane, CNM No   Overview Addendum 10/07/2017  1:45 PM by Oswaldo ConroySchmid, Wilfredo Canterbury Y, CNM    Clinic [facility] Prenatal Labs  Dating US Blood type:A POS(06/29 [phone])   Genetic Screen NIPS: Antibody:Negative (07/30 1346)  Anatomic US  Rubella: 1.52 (07/30 1346) Varicella: Immune  GTT Third trimester:  RPR: Non Reactive (07/30 1346)   Rhogam  HBsAg: Negative (07/30 1346)   TDaP vaccine                       Flu Shot: HIV: Non Reactive (07/30 1346)   Baby Food                                GBS:   Contraception  Pap:  CBB     CS/VBAC    Support Person Boyfriend Stacey Donovan            Growth scan today with growth at 49.5th percentile, EFW 2 lb, 11 oz and cephalic presentation.  Discussed doula program and touring labor and delivery.  Please refer to After Visit Summary for other counseling recommendations.   Return in about 2 weeks (around 01/26/2018) for ROB.  Stacey Donovan, CNM 01/12/2018  11:54 AM

## 2018-01-12 NOTE — Patient Instructions (Signed)
Third Trimester of Pregnancy The third trimester is from week 28 through week 40 (months 7 through 9). The third trimester is a time when the unborn baby (fetus) is growing rapidly. At the end of the ninth month, the fetus is about 20 inches in length and weighs 6-10 pounds. Body changes during your third trimester Your body will continue to go through many changes during pregnancy. The changes vary from woman to woman. During the third trimester:  Your weight will continue to increase. You can expect to gain 25-35 pounds (11-16 kg) by the end of the pregnancy.  You may begin to get stretch marks on your hips, abdomen, and breasts.  You may urinate more often because the fetus is moving lower into your pelvis and pressing on your bladder.  You may develop or continue to have heartburn. This is caused by increased hormones that slow down muscles in the digestive tract.  You may develop or continue to have constipation because increased hormones slow digestion and cause the muscles that push waste through your intestines to relax.  You may develop hemorrhoids. These are swollen veins (varicose veins) in the rectum that can itch or be painful.  You may develop swollen, bulging veins (varicose veins) in your legs.  You may have increased body aches in the pelvis, back, or thighs. This is due to weight gain and increased hormones that are relaxing your joints.  You may have changes in your hair. These can include thickening of your hair, rapid growth, and changes in texture. Some women also have hair loss during or after pregnancy, or hair that feels dry or thin. Your hair will most likely return to normal after your baby is born.  Your breasts will continue to grow and they will continue to become tender. A yellow fluid (colostrum) may leak from your breasts. This is the first milk you are producing for your baby.  Your belly button may stick out.  You may notice more swelling in your hands,  face, or ankles.  You may have increased tingling or numbness in your hands, arms, and legs. The skin on your belly may also feel numb.  You may feel short of breath because of your expanding uterus.  You may have more problems sleeping. This can be caused by the size of your belly, increased need to urinate, and an increase in your body's metabolism.  You may notice the fetus "dropping," or moving lower in your abdomen (lightening).  You may have increased vaginal discharge.  You may notice your joints feel loose and you may have pain around your pelvic bone.  What to expect at prenatal visits You will have prenatal exams every 2 weeks until week 36. Then you will have weekly prenatal exams. During a routine prenatal visit:  You will be weighed to make sure you and the baby are growing normally.  Your blood pressure will be taken.  Your abdomen will be measured to track your baby's growth.  The fetal heartbeat will be listened to.  Any test results from the previous visit will be discussed.  You may have a cervical check near your due date to see if your cervix has softened or thinned (effaced).  You will be tested for Group B streptococcus. This happens between 35 and 37 weeks.  Your health care provider may ask you:  What your birth plan is.  How you are feeling.  If you are feeling the baby move.  If you have had   any abnormal symptoms, such as leaking fluid, bleeding, severe headaches, or abdominal cramping.  If you are using any tobacco products, including cigarettes, chewing tobacco, and electronic cigarettes.  If you have any questions.  Other tests or screenings that may be performed during your third trimester include:  Blood tests that check for low iron levels (anemia).  Fetal testing to check the health, activity level, and growth of the fetus. Testing is done if you have certain medical conditions or if there are problems during the  pregnancy.  Nonstress test (NST). This test checks the health of your baby to make sure there are no signs of problems, such as the baby not getting enough oxygen. During this test, a belt is placed around your belly. The baby is made to move, and its heart rate is monitored during movement.  What is false labor? False labor is a condition in which you feel small, irregular tightenings of the muscles in the womb (contractions) that usually go away with rest, changing position, or drinking water. These are called Braxton Hicks contractions. Contractions may last for hours, days, or even weeks before true labor sets in. If contractions come at regular intervals, become more frequent, increase in intensity, or become painful, you should see your health care provider. What are the signs of labor?  Abdominal cramps.  Regular contractions that start at 10 minutes apart and become stronger and more frequent with time.  Contractions that start on the top of the uterus and spread down to the lower abdomen and back.  Increased pelvic pressure and dull back pain.  A watery or bloody mucus discharge that comes from the vagina.  Leaking of amniotic fluid. This is also known as your "water breaking." It could be a slow trickle or a gush. Let your health care provider know if it has a color or strange odor. If you have any of these signs, call your health care provider right away, even if it is before your due date. Follow these instructions at home: Medicines  Follow your health care provider's instructions regarding medicine use. Specific medicines may be either safe or unsafe to take during pregnancy.  Take a prenatal vitamin that contains at least 600 micrograms (mcg) of folic acid.  If you develop constipation, try taking a stool softener if your health care provider approves. Eating and drinking  Eat a balanced diet that includes fresh fruits and vegetables, whole grains, good sources of protein  such as meat, eggs, or tofu, and low-fat dairy. Your health care provider will help you determine the amount of weight gain that is right for you.  Avoid raw meat and uncooked cheese. These carry germs that can cause birth defects in the baby.  If you have low calcium intake from food, talk to your health care provider about whether you should take a daily calcium supplement.  Eat four or five small meals rather than three large meals a day.  Limit foods that are high in fat and processed sugars, such as fried and sweet foods.  To prevent constipation: ? Drink enough fluid to keep your urine clear or pale yellow. ? Eat foods that are high in fiber, such as fresh fruits and vegetables, whole grains, and beans. Activity  Exercise only as directed by your health care provider. Most women can continue their usual exercise routine during pregnancy. Try to exercise for 30 minutes at least 5 days a week. Stop exercising if you experience uterine contractions.  Avoid heavy   lifting.  Do not exercise in extreme heat or humidity, or at high altitudes.  Wear low-heel, comfortable shoes.  Practice good posture.  You may continue to have sex unless your health care provider tells you otherwise. Relieving pain and discomfort  Take frequent breaks and rest with your legs elevated if you have leg cramps or low back pain.  Take warm sitz baths to soothe any pain or discomfort caused by hemorrhoids. Use hemorrhoid cream if your health care provider approves.  Wear a good support bra to prevent discomfort from breast tenderness.  If you develop varicose veins: ? Wear support pantyhose or compression stockings as told by your healthcare provider. ? Elevate your feet for 15 minutes, 3-4 times a day. Prenatal care  Write down your questions. Take them to your prenatal visits.  Keep all your prenatal visits as told by your health care provider. This is important. Safety  Wear your seat belt at  all times when driving.  Make a list of emergency phone numbers, including numbers for family, friends, the hospital, and police and fire departments. General instructions  Avoid cat litter boxes and soil used by cats. These carry germs that can cause birth defects in the baby. If you have a cat, ask someone to clean the litter box for you.  Do not travel far distances unless it is absolutely necessary and only with the approval of your health care provider.  Do not use hot tubs, steam rooms, or saunas.  Do not drink alcohol.  Do not use any products that contain nicotine or tobacco, such as cigarettes and e-cigarettes. If you need help quitting, ask your health care provider.  Do not use any medicinal herbs or unprescribed drugs. These chemicals affect the formation and growth of the baby.  Do not douche or use tampons or scented sanitary pads.  Do not cross your legs for long periods of time.  To prepare for the arrival of your baby: ? Take prenatal classes to understand, practice, and ask questions about labor and delivery. ? Make a trial run to the hospital. ? Visit the hospital and tour the maternity area. ? Arrange for maternity or paternity leave through employers. ? Arrange for family and friends to take care of pets while you are in the hospital. ? Purchase a rear-facing car seat and make sure you know how to install it in your car. ? Pack your hospital bag. ? Prepare the baby's nursery. Make sure to remove all pillows and stuffed animals from the baby's crib to prevent suffocation.  Visit your dentist if you have not gone during your pregnancy. Use a soft toothbrush to brush your teeth and be gentle when you floss. Contact a health care provider if:  You are unsure if you are in labor or if your water has broken.  You become dizzy.  You have mild pelvic cramps, pelvic pressure, or nagging pain in your abdominal area.  You have lower back pain.  You have persistent  nausea, vomiting, or diarrhea.  You have an unusual or bad smelling vaginal discharge.  You have pain when you urinate. Get help right away if:  Your water breaks before 37 weeks.  You have regular contractions less than 5 minutes apart before 37 weeks.  You have a fever.  You are leaking fluid from your vagina.  You have spotting or bleeding from your vagina.  You have severe abdominal pain or cramping.  You have rapid weight loss or weight gain.    You have shortness of breath with chest pain.  You notice sudden or extreme swelling of your face, hands, ankles, feet, or legs.  Your baby makes fewer than 10 movements in 2 hours.  You have severe headaches that do not go away when you take medicine.  You have vision changes. Summary  The third trimester is from week 28 through week 40, months 7 through 9. The third trimester is a time when the unborn baby (fetus) is growing rapidly.  During the third trimester, your discomfort may increase as you and your baby continue to gain weight. You may have abdominal, leg, and back pain, sleeping problems, and an increased need to urinate.  During the third trimester your breasts will keep growing and they will continue to become tender. A yellow fluid (colostrum) may leak from your breasts. This is the first milk you are producing for your baby.  False labor is a condition in which you feel small, irregular tightenings of the muscles in the womb (contractions) that eventually go away. These are called Braxton Hicks contractions. Contractions may last for hours, days, or even weeks before true labor sets in.  Signs of labor can include: abdominal cramps; regular contractions that start at 10 minutes apart and become stronger and more frequent with time; watery or bloody mucus discharge that comes from the vagina; increased pelvic pressure and dull back pain; and leaking of amniotic fluid. This information is not intended to replace advice  given to you by your health care provider. Make sure you discuss any questions you have with your health care provider. Document Released: 01/21/2001 Document Revised: 07/05/2015 Document Reviewed: 03/30/2012 Elsevier Interactive Patient Education  2017 [employer]  

## 2018-01-13 LAB — 28 WEEK RH+PANEL
BASOS: 0 %
Basophils Absolute: 0 10*3/uL (ref 0.0–0.2)
EOS (ABSOLUTE): 0.1 10*3/uL (ref 0.0–0.4)
EOS: 1 %
GESTATIONAL DIABETES SCREEN: 86 mg/dL (ref 65–139)
HEMOGLOBIN: 11.3 g/dL (ref 11.1–15.9)
HIV Screen 4th Generation wRfx: NONREACTIVE
Hematocrit: 33.6 % — ABNORMAL LOW (ref 34.0–46.6)
Immature Grans (Abs): 0.1 10*3/uL (ref 0.0–0.1)
Immature Granulocytes: 1 %
Lymphocytes Absolute: 2 10*3/uL (ref 0.7–3.1)
Lymphs: 19 %
MCH: 28.7 pg (ref 26.6–33.0)
MCHC: 33.6 g/dL (ref 31.5–35.7)
MCV: 85 fL (ref 79–97)
Monocytes Absolute: 0.7 10*3/uL (ref 0.1–0.9)
Monocytes: 7 %
NEUTROS ABS: 7.4 10*3/uL — AB (ref 1.4–7.0)
Neutrophils: 72 %
Platelets: 375 10*3/uL (ref 150–450)
RBC: 3.94 x10E6/uL (ref 3.77–5.28)
RDW: 12 % — ABNORMAL LOW (ref 12.3–15.4)
RPR: NONREACTIVE
WBC: 10.4 10*3/uL (ref 3.4–10.8)

## 2018-01-26 ENCOUNTER — Ambulatory Visit (INDEPENDENT_AMBULATORY_CARE_PROVIDER_SITE_OTHER): Payer: Medicaid Other | Admitting: Obstetrics & Gynecology

## 2018-01-26 VITALS — BP 120/80 | Wt 147.0 lb

## 2018-01-26 DIAGNOSIS — O0990 Supervision of high risk pregnancy, unspecified, unspecified trimester: Secondary | ICD-10-CM

## 2018-01-26 DIAGNOSIS — Z3A30 30 weeks gestation of pregnancy: Secondary | ICD-10-CM

## 2018-01-26 LAB — POCT URINALYSIS DIPSTICK OB
Glucose, UA: NEGATIVE
PROTEIN: NEGATIVE

## 2018-01-26 NOTE — Patient Instructions (Signed)
Third Trimester of Pregnancy The third trimester is from week 28 through week 40 (months 7 through 9). The third trimester is a time when the unborn baby (fetus) is growing rapidly. At the end of the ninth month, the fetus is about 20 inches in length and weighs 6-10 pounds. Body changes during your third trimester Your body will continue to go through many changes during pregnancy. The changes vary from woman to woman. During the third trimester:  Your weight will continue to increase. You can expect to gain 25-35 pounds (11-16 kg) by the end of the pregnancy.  You may begin to get stretch marks on your hips, abdomen, and breasts.  You may urinate more often because the fetus is moving lower into your pelvis and pressing on your bladder.  You may develop or continue to have heartburn. This is caused by increased hormones that slow down muscles in the digestive tract.  You may develop or continue to have constipation because increased hormones slow digestion and cause the muscles that push waste through your intestines to relax.  You may develop hemorrhoids. These are swollen veins (varicose veins) in the rectum that can itch or be painful.  You may develop swollen, bulging veins (varicose veins) in your legs.  You may have increased body aches in the pelvis, back, or thighs. This is due to weight gain and increased hormones that are relaxing your joints.  You may have changes in your hair. These can include thickening of your hair, rapid growth, and changes in texture. Some women also have hair loss during or after pregnancy, or hair that feels dry or thin. Your hair will most likely return to normal after your baby is born.  Your breasts will continue to grow and they will continue to become tender. A yellow fluid (colostrum) may leak from your breasts. This is the first milk you are producing for your baby.  Your belly button may stick out.  You may notice more swelling in your hands,  face, or ankles.  You may have increased tingling or numbness in your hands, arms, and legs. The skin on your belly may also feel numb.  You may feel short of breath because of your expanding uterus.  You may have more problems sleeping. This can be caused by the size of your belly, increased need to urinate, and an increase in your body's metabolism.  You may notice the fetus "dropping," or moving lower in your abdomen (lightening).  You may have increased vaginal discharge.  You may notice your joints feel loose and you may have pain around your pelvic bone.  What to expect at prenatal visits You will have prenatal exams every 2 weeks until week 36. Then you will have weekly prenatal exams. During a routine prenatal visit:  You will be weighed to make sure you and the baby are growing normally.  Your blood pressure will be taken.  Your abdomen will be measured to track your baby's growth.  The fetal heartbeat will be listened to.  Any test results from the previous visit will be discussed.  You may have a cervical check near your due date to see if your cervix has softened or thinned (effaced).  You will be tested for Group B streptococcus. This happens between 35 and 37 weeks.  Your health care provider may ask you:  What your birth plan is.  How you are feeling.  If you are feeling the baby move.  If you have had   any abnormal symptoms, such as leaking fluid, bleeding, severe headaches, or abdominal cramping.  If you are using any tobacco products, including cigarettes, chewing tobacco, and electronic cigarettes.  If you have any questions.  Other tests or screenings that may be performed during your third trimester include:  Blood tests that check for low iron levels (anemia).  Fetal testing to check the health, activity level, and growth of the fetus. Testing is done if you have certain medical conditions or if there are problems during the  pregnancy.  Nonstress test (NST). This test checks the health of your baby to make sure there are no signs of problems, such as the baby not getting enough oxygen. During this test, a belt is placed around your belly. The baby is made to move, and its heart rate is monitored during movement.  What is false labor? False labor is a condition in which you feel small, irregular tightenings of the muscles in the womb (contractions) that usually go away with rest, changing position, or drinking water. These are called Braxton Hicks contractions. Contractions may last for hours, days, or even weeks before true labor sets in. If contractions come at regular intervals, become more frequent, increase in intensity, or become painful, you should see your health care provider. What are the signs of labor?  Abdominal cramps.  Regular contractions that start at 10 minutes apart and become stronger and more frequent with time.  Contractions that start on the top of the uterus and spread down to the lower abdomen and back.  Increased pelvic pressure and dull back pain.  A watery or bloody mucus discharge that comes from the vagina.  Leaking of amniotic fluid. This is also known as your "water breaking." It could be a slow trickle or a gush. Let your health care provider know if it has a color or strange odor. If you have any of these signs, call your health care provider right away, even if it is before your due date. Follow these instructions at home: Medicines  Follow your health care provider's instructions regarding medicine use. Specific medicines may be either safe or unsafe to take during pregnancy.  Take a prenatal vitamin that contains at least 600 micrograms (mcg) of folic acid.  If you develop constipation, try taking a stool softener if your health care provider approves. Eating and drinking  Eat a balanced diet that includes fresh fruits and vegetables, whole grains, good sources of protein  such as meat, eggs, or tofu, and low-fat dairy. Your health care provider will help you determine the amount of weight gain that is right for you.  Avoid raw meat and uncooked cheese. These carry germs that can cause birth defects in the baby.  If you have low calcium intake from food, talk to your health care provider about whether you should take a daily calcium supplement.  Eat four or five small meals rather than three large meals a day.  Limit foods that are high in fat and processed sugars, such as fried and sweet foods.  To prevent constipation: ? Drink enough fluid to keep your urine clear or pale yellow. ? Eat foods that are high in fiber, such as fresh fruits and vegetables, whole grains, and beans. Activity  Exercise only as directed by your health care provider. Most women can continue their usual exercise routine during pregnancy. Try to exercise for 30 minutes at least 5 days a week. Stop exercising if you experience uterine contractions.  Avoid heavy   lifting.  Do not exercise in extreme heat or humidity, or at high altitudes.  Wear low-heel, comfortable shoes.  Practice good posture.  You may continue to have sex unless your health care provider tells you otherwise. Relieving pain and discomfort  Take frequent breaks and rest with your legs elevated if you have leg cramps or low back pain.  Take warm sitz baths to soothe any pain or discomfort caused by hemorrhoids. Use hemorrhoid cream if your health care provider approves.  Wear a good support bra to prevent discomfort from breast tenderness.  If you develop varicose veins: ? Wear support pantyhose or compression stockings as told by your healthcare provider. ? Elevate your feet for 15 minutes, 3-4 times a day. Prenatal care  Write down your questions. Take them to your prenatal visits.  Keep all your prenatal visits as told by your health care provider. This is important. Safety  Wear your seat belt at  all times when driving.  Make a list of emergency phone numbers, including numbers for family, friends, the hospital, and police and fire departments. General instructions  Avoid cat litter boxes and soil used by cats. These carry germs that can cause birth defects in the baby. If you have a cat, ask someone to clean the litter box for you.  Do not travel far distances unless it is absolutely necessary and only with the approval of your health care provider.  Do not use hot tubs, steam rooms, or saunas.  Do not drink alcohol.  Do not use any products that contain nicotine or tobacco, such as cigarettes and e-cigarettes. If you need help quitting, ask your health care provider.  Do not use any medicinal herbs or unprescribed drugs. These chemicals affect the formation and growth of the baby.  Do not douche or use tampons or scented sanitary pads.  Do not cross your legs for long periods of time.  To prepare for the arrival of your baby: ? Take prenatal classes to understand, practice, and ask questions about labor and delivery. ? Make a trial run to the hospital. ? Visit the hospital and tour the maternity area. ? Arrange for maternity or paternity leave through employers. ? Arrange for family and friends to take care of pets while you are in the hospital. ? Purchase a rear-facing car seat and make sure you know how to install it in your car. ? Pack your hospital bag. ? Prepare the baby's nursery. Make sure to remove all pillows and stuffed animals from the baby's crib to prevent suffocation.  Visit your dentist if you have not gone during your pregnancy. Use a soft toothbrush to brush your teeth and be gentle when you floss. Contact a health care provider if:  You are unsure if you are in labor or if your water has broken.  You become dizzy.  You have mild pelvic cramps, pelvic pressure, or nagging pain in your abdominal area.  You have lower back pain.  You have persistent  nausea, vomiting, or diarrhea.  You have an unusual or bad smelling vaginal discharge.  You have pain when you urinate. Get help right away if:  Your water breaks before 37 weeks.  You have regular contractions less than 5 minutes apart before 37 weeks.  You have a fever.  You are leaking fluid from your vagina.  You have spotting or bleeding from your vagina.  You have severe abdominal pain or cramping.  You have rapid weight loss or weight gain.    You have shortness of breath with chest pain.  You notice sudden or extreme swelling of your face, hands, ankles, feet, or legs.  Your baby makes fewer than 10 movements in 2 hours.  You have severe headaches that do not go away when you take medicine.  You have vision changes. Summary  The third trimester is from week 28 through week 40, months 7 through 9. The third trimester is a time when the unborn baby (fetus) is growing rapidly.  During the third trimester, your discomfort may increase as you and your baby continue to gain weight. You may have abdominal, leg, and back pain, sleeping problems, and an increased need to urinate.  During the third trimester your breasts will keep growing and they will continue to become tender. A yellow fluid (colostrum) may leak from your breasts. This is the first milk you are producing for your baby.  False labor is a condition in which you feel small, irregular tightenings of the muscles in the womb (contractions) that eventually go away. These are called Braxton Hicks contractions. Contractions may last for hours, days, or even weeks before true labor sets in.  Signs of labor can include: abdominal cramps; regular contractions that start at 10 minutes apart and become stronger and more frequent with time; watery or bloody mucus discharge that comes from the vagina; increased pelvic pressure and dull back pain; and leaking of amniotic fluid. This information is not intended to replace advice  given to you by your health care provider. Make sure you discuss any questions you have with your health care provider. Document Released: 01/21/2001 Document Revised: 07/05/2015 Document Reviewed: 03/30/2012 Elsevier Interactive Patient Education  2017 [employer]  

## 2018-01-26 NOTE — Addendum Note (Signed)
Addended by: Cornelius MorasPATTERSON, Zamorah Ailes D on: 01/26/2018 11:46 AM   Modules accepted: Orders

## 2018-01-26 NOTE — Progress Notes (Signed)
  Subjective  Fetal Movement? yes Contractions? no Leaking Fluid? no Vaginal Bleeding? no  Objective  BP 120/80   Wt 147 lb (66.7 kg)   LMP 06/21/2017 (Approximate)   BMI 27.78 kg/m  General: NAD Pumonary: no increased work of breathing Abdomen: gravid, non-tender Extremities: no edema Psychiatric: mood appropriate, affect full  Assessment  22 y.o. 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

## 2018-02-09 ENCOUNTER — Encounter: Payer: Medicaid Other | Admitting: Maternal Newborn

## 2018-02-17 ENCOUNTER — Encounter: Payer: Medicaid Other | Admitting: Certified Nurse Midwife

## 2018-02-22 ENCOUNTER — Ambulatory Visit (INDEPENDENT_AMBULATORY_CARE_PROVIDER_SITE_OTHER): Payer: Self-pay | Admitting: Certified Nurse Midwife

## 2018-02-22 ENCOUNTER — Other Ambulatory Visit: Payer: Self-pay

## 2018-02-22 ENCOUNTER — Observation Stay
Admission: EM | Admit: 2018-02-22 | Discharge: 2018-02-22 | Disposition: A | Discharge status: Home / Self Care | Payer: Medicaid Other | Attending: Obstetrics & Gynecology | Admitting: Obstetrics & Gynecology

## 2018-02-22 VITALS — BP 100/60 | Wt 152.0 lb

## 2018-02-22 DIAGNOSIS — Z87891 Personal history of nicotine dependence: Secondary | ICD-10-CM | POA: Insufficient documentation

## 2018-02-22 DIAGNOSIS — O99713 Diseases of the skin and subcutaneous tissue complicating pregnancy, third trimester: Secondary | ICD-10-CM

## 2018-02-22 DIAGNOSIS — Z3A34 34 weeks gestation of pregnancy: Secondary | ICD-10-CM

## 2018-02-22 DIAGNOSIS — L309 Dermatitis, unspecified: Secondary | ICD-10-CM

## 2018-02-22 DIAGNOSIS — O4703 False labor before 37 completed weeks of gestation, third trimester: Secondary | ICD-10-CM | POA: Diagnosis not present

## 2018-02-22 DIAGNOSIS — R112 Nausea with vomiting, unspecified: Secondary | ICD-10-CM | POA: Diagnosis present

## 2018-02-22 DIAGNOSIS — O0993 Supervision of high risk pregnancy, unspecified, third trimester: Secondary | ICD-10-CM

## 2018-02-22 DIAGNOSIS — O0990 Supervision of high risk pregnancy, unspecified, unspecified trimester: Secondary | ICD-10-CM

## 2018-02-22 DIAGNOSIS — R109 Unspecified abdominal pain: Secondary | ICD-10-CM | POA: Diagnosis present

## 2018-02-22 DIAGNOSIS — O26893 Other specified pregnancy related conditions, third trimester: Secondary | ICD-10-CM | POA: Diagnosis present

## 2018-02-22 LAB — URINALYSIS, COMPLETE (UACMP) WITH MICROSCOPIC
Bacteria, UA: NONE SEEN
Bilirubin Urine: NEGATIVE
GLUCOSE, UA: NEGATIVE mg/dL
Hgb urine dipstick: NEGATIVE
Ketones, ur: NEGATIVE mg/dL
Leukocytes, UA: NEGATIVE
Nitrite: NEGATIVE
Protein, ur: NEGATIVE mg/dL
Specific Gravity, Urine: 1.009 (ref 1.005–1.030)
pH: 7 (ref 5.0–8.0)

## 2018-02-22 LAB — POCT URINALYSIS DIPSTICK OB
GLUCOSE, UA: NEGATIVE
POC,PROTEIN,UA: NEGATIVE

## 2018-02-22 MED ORDER — HYDROXYZINE HCL 25 MG PO TABS: 25.0000 mg | ORAL_TABLET | Freq: Four times a day (QID) | ORAL | 2 refills | Status: DC | PRN

## 2018-02-22 MED ORDER — TRIAMCINOLONE ACETONIDE 0.1 % EX CREA: 1.0000 "application " | TOPICAL_CREAM | Freq: Two times a day (BID) | CUTANEOUS | 1 refills | Status: DC

## 2018-02-22 MED ORDER — ACETAMINOPHEN 325 MG PO TABS: 650.0000 mg | ORAL_TABLET | ORAL | Status: DC | PRN

## 2018-02-22 MED ORDER — TETANUS-DIPHTH-ACELL PERTUSSIS 5-2.5-18.5 LF-MCG/0.5 IM SUSP
0.5000 mL | Freq: Once | INTRAMUSCULAR | Status: DC
  Administered 2018-02-22: 0.5 mL via INTRAMUSCULAR

## 2018-02-22 NOTE — Discharge Summary (Signed)
See Final Progress Note 02/22/2018. 

## 2018-02-22 NOTE — Addendum Note (Signed)
Addended by: Cornelius Moras D on: 02/22/2018 01:06 PM   Modules accepted: Orders

## 2018-02-22 NOTE — Final Progress Note (Signed)
Physician Final Progress Note  Patient ID: LYNISHA OSUCH MRN: [id number] DOB/AGE: 06/17/1995 23 y.o.  Admit date: 02/22/2018 Admitting provider: Nadara Mustard, MD Discharge date: 02/22/2018   Admission Diagnoses: Preterm contractions  Discharge Diagnoses: Uterine irritability  History of Present Illness: The patient is a 23 y.o. female G1P0 at [redacted]w[redacted]d who presents from clinic with contractions and suprapubic pain over the last two days. Her cervix was checked in the office and found to be 1/75/-1, wet prep and nitrazine negative. She had some nausea and vomiting and an episode of diarrhea yesterday. She has not had these symptoms today, and has been able to keep food and liquids down. No loss of fluid or vaginal bleeding. Baby is moving well. She has an itchy rash on her abdomen and received prescriptions in the office today to treat this.  Review of Systems: Review of systems negative unless otherwise noted in HPI.   Past Medical History:  Diagnosis Date  . Acute appendicitis with generalized peritonitis   . Appendicitis 09/04/2016  . Hypoglycemia     Past Surgical History:  Procedure Laterality Date  . LAPAROSCOPIC APPENDECTOMY N/A 09/04/2016   Procedure: APPENDECTOMY LAPAROSCOPIC;  Surgeon: Ricarda Frame, MD;  Location: [facility] ORS;  Service: General;  Laterality: N/A;    No current facility-administered medications on file prior to encounter.    Current Outpatient Medications on File Prior to Encounter  Medication Sig Dispense Refill  . Prenatal Vit-Fe Fumarate-FA (MULTIVITAMIN-PRENATAL) 27-0.8 MG TABS tablet Take 1 tablet by mouth daily at 12 noon.    . hydrOXYzine (ATARAX/VISTARIL) 25 MG tablet Take 1 tablet (25 mg total) by mouth every 6 (six) hours as needed for itching. 30 tablet 2  . triamcinolone cream (KENALOG) 0.1 % Apply 1 application topically 2 (two) times daily. 30 g 1    No Known Allergies  Social History   Socioeconomic History  . Marital status:  Single    Spouse name: Not on file  . Number of children: Not on file  . Years of education: Not on file  . Highest education level: Not on file  Occupational History  . Not on file  Social Needs  . Financial resource strain: Not on file  . Food insecurity:    Worry: Not on file    Inability: Not on file  . Transportation needs:    Medical: Not on file    Non-medical: Not on file  Tobacco Use  . Smoking status: Former Smoker    Packs/day: 1.00    Types: Cigarettes  . Smokeless tobacco: Never Used  Substance and Sexual Activity  . Alcohol use: No  . Drug use: Not Currently    Types: Marijuana  . Sexual activity: Not on file  Lifestyle  . Physical activity:    Days per week: Not on file    Minutes per session: Not on file  . Stress: Not on file  Relationships  . Social connections:    Talks on phone: Not on file    Gets together: Not on file    Attends religious service: Not on file    Active member of club or organization: Not on file    Attends meetings of clubs or organizations: Not on file    Relationship status: Not on file  . Intimate partner violence:    Fear of current or ex partner: Not on file    Emotionally abused: Not on file    Physically abused: Not on file    Forced  sexual activity: Not on file  Other Topics Concern  . Not on file  Social History Narrative  . Not on file   Family history: Negative/unremarkable except as detailed in HPI.   Physical Exam: Temp 97.9 F (36.6 C) (Oral)   Resp 20   Ht 5\' 2"  (1.575 m)   Wt 70.3 kg   LMP 06/21/2017 (Approximate)   BMI 28.35 kg/m   Gen: NAD CV: Regular rate Pulm: No increased work of breathing Pelvic: deferred Ext: No signs of DVT  NST Baseline: 135 Variability: moderate Accelerations: present Decelerations: absent Tocometry: uterine irritability, no contractions palpated or traced for 2.5 hours The patient was monitored for >1 hour fetal heart rate tracing was deemed reactive.  Consults:  None  Significant Findings/ Diagnostic Studies: labs: UA negative  Procedures: NST  Discharge Condition: good  Disposition: Discharge disposition: 01-Home or Self Care       Diet: Regular diet  Discharge Activity: Activity as tolerated  Discharge Instructions    Discharge activity:  No Restrictions   Complete by:  As directed    Discharge diet:  No restrictions   Complete by:  As directed    Fetal Kick Count:  Lie on our left side for one hour after a meal, and count the number of times your baby kicks.  If it is less than 5 times, get up, move around and drink some juice.  Repeat the test 30 minutes later.  If it is still less than 5 kicks in an hour, notify your doctor.   Complete by:  As directed    LABOR:  When conractions begin, you should start to time them from the beginning of one contraction to the beginning  of the next.  When contractions are 5 - 10 minutes apart or less and have been regular for at least an hour, you should call your health care provider.   Complete by:  As directed    Notify physician for bleeding from the vagina   Complete by:  As directed    Notify physician for blurring of vision or spots before the eyes   Complete by:  As directed    Notify physician for chills or fever   Complete by:  As directed    Notify physician for fainting spells, "black outs" or loss of consciousness   Complete by:  As directed    Notify physician for increase in vaginal discharge   Complete by:  As directed    Notify physician for leaking of fluid   Complete by:  As directed    Notify physician for pain or burning when urinating   Complete by:  As directed    Notify physician for pelvic pressure (sudden increase)   Complete by:  As directed    Notify physician for severe or continued nausea or vomiting   Complete by:  As directed    Notify physician for sudden gushing of fluid from the vagina (with or without continued leaking)   Complete by:  As directed     Notify physician for sudden, constant, or occasional abdominal pain   Complete by:  As directed    Notify physician if baby moving less than usual   Complete by:  As directed    Sexual Activity:     Complete by:  As directed    Nothing in the vagina until after the fetal fibronectin tomorrow     Allergies as of 02/22/2018   No Known Allergies  Medication List    STOP taking these medications   docusate sodium 100 MG capsule Commonly known as:  COLACE   promethazine 25 MG tablet Commonly known as:  PHENERGAN   sertraline 50 MG tablet Commonly known as:  ZOLOFT     TAKE these medications   hydrOXYzine 25 MG tablet Commonly known as:  ATARAX/VISTARIL Take 1 tablet (25 mg total) by mouth every 6 (six) hours as needed for itching.   multivitamin-prenatal 27-0.8 MG Tabs tablet Take 1 tablet by mouth daily at 12 noon.   triamcinolone cream 0.1 % Commonly known as:  KENALOG Apply 1 application topically 2 (two) times daily.      Follow-up Information    [facility]. Schedule an appointment as soon as possible for a visit in 1 day(s).   Specialty:  Obstetrics and Gynecology Why:  Fetal fibronectin in office tomorrow after 10 am Contact information: [address] [place] [address] [phone]         Will go to office tomorrow for fetal fibronectin. Return precautions reviewed. Discussed comfort measures for uterine irritability such as rest, hydration, etc.  Signed: Oswaldo Conroy, CNM  02/22/2018

## 2018-02-22 NOTE — OB Triage Note (Signed)
Patient arrived from [facility] with complaints of contractions.  No vaginal bleeding or leaking of fluid.  Fetal movement normal.

## 2018-02-22 NOTE — Progress Notes (Signed)
HROB with bicornate uterus at 34wk 2d: Complains of episodes of regular contractions over the last 2 days including this AM with pain in her suprapubic area. +mucous discharge. Breathing thru contractions. Had nausea/ vomiting and diarrhea yesterday AM, but eating and drinking OK since then. Wet prep negative. Negative Nitrazine Cervix: 1/75%/-1  Cephalic on ultrasound Also complains of a pruritic rash that started on her abdomen and now seems to be spreading under breasts Macular papular rash midline upper abdomen and extending under breasts Possible PUPPS.  To L&D for evaluation of possible preterm labor RX for atarax and Kenalog cream to pharmacy Farrel Conners, CNM

## 2018-02-22 NOTE — Progress Notes (Signed)
C/O contractions, pressure, d/c, rash on abdomen x3 days. FKC's given

## 2018-02-23 LAB — URINE CULTURE: Culture: <10000 — AB

## 2018-02-25 ENCOUNTER — Encounter: Payer: Medicaid Other | Admitting: Maternal Newborn

## 2018-02-25 ENCOUNTER — Ambulatory Visit (INDEPENDENT_AMBULATORY_CARE_PROVIDER_SITE_OTHER): Payer: Medicaid Other | Admitting: Obstetrics and Gynecology

## 2018-02-25 VITALS — BP 108/70 | Wt 156.0 lb

## 2018-02-25 DIAGNOSIS — Z3A34 34 weeks gestation of pregnancy: Secondary | ICD-10-CM

## 2018-02-25 DIAGNOSIS — O0990 Supervision of high risk pregnancy, unspecified, unspecified trimester: Secondary | ICD-10-CM

## 2018-02-25 NOTE — Progress Notes (Signed)
ROB L&D follow up  

## 2018-02-26 NOTE — Progress Notes (Signed)
Routine Prenatal Care Visit  Subjective  Stacey Donovan is a 23 y.o. G1P0 at [redacted]w[redacted]d being seen today for ongoing prenatal care.  She is currently monitored for the following issues for this low-risk pregnancy and has Supervision of high risk pregnancy, antepartum and Pregnancy related hip pain, antepartum on their problem list.  ----------------------------------------------------------------------------------- Patient reports no complaints.  Was seen on L&D earlier this week with symptomatic contractions Contractions: Irregular. Vag. Bleeding: None.  Movement: Present. Denies leaking of fluid.  ----------------------------------------------------------------------------------- The following portions of the patient's history were reviewed and updated as appropriate: allergies, current medications, past family history, past medical history, past social history, past surgical history and problem list. Problem list updated.   Objective  Blood pressure 108/70, weight 156 lb (70.8 kg), last menstrual period 06/21/2017. Pregravid weight 109 lb (49.4 kg) Total Weight Gain 47 lb (21.3 kg) Urinalysis:      Fetal Status: Fetal Heart Rate (bpm): 140   Movement: Present  Presentation: Vertex  General:  Alert, oriented and cooperative. Patient is in no acute distress.  Skin: Skin is warm and dry. No rash noted.   Cardiovascular: Normal heart rate noted  Respiratory: Normal respiratory effort, no problems with respiration noted  Abdomen: Soft, gravid, appropriate for gestational age. Pain/Pressure: Present     Pelvic:  Cervical exam deferred        Extremities: Normal range of motion.     ental Status: Normal mood and affect. Normal behavior. Normal judgment and thought content.     Assessment   23 y.o. G1P0 at [redacted]w[redacted]d by  04/03/2018, by Ultrasound presenting for routine prenatal visit  Plan   Pregnancy#1 Problems (from 06/21/17 to present)    Problem Noted Resolved   Supervision of  high risk pregnancy, antepartum 08/11/2017 by Tresea Mall, CNM No   Overview Addendum 10/07/2017  1:45 PM by Oswaldo Conroy, CNM    [facility] Prenatal Labs  Dating [place] Blood type:A POS(06/29 1917)   Genetic Screen NIPS: Antibody:Negative (07/30 1346)  Anatomic [place]  Rubella: 1.52 (07/30 1346) Varicella: Immune  GTT Third trimester:  RPR: Non Reactive (07/30 1346)   Rhogam  HBsAg: Negative (07/30 1346)   TDaP vaccine                       Flu Shot: HIV: Non Reactive (07/30 1346)   Baby Food                                GBS:   Contraception  Pap:  CBB     CS/VBAC    Support Person Boyfriend Apolinar Junes               Gestational age appropriate obstetric precautions including but not limited to vaginal bleeding, contractions, leaking of fluid and fetal movement were reviewed in detail with the patient.    - discussed preterm contraction vs preterm labor.  We discussed that FFN testing has been evaluated in patient with cervical dilation and preterm contraction but that its use is not helpful after 34 weeks given that at that gestational age FFN may start to be detected normally while prior to 34 weeks it is generally absent.   "Fetal fibronectin (FFN) is a glycoprotein located between the chorion and decidua that is absent or found only in low levels in cervicovaginal secretions between 22 and 34 weeks. " Society Maternal Fetal Medicine "When to  use fetal fibronectin" Reaffirmed August 2016  Return in about 2 weeks (around 03/11/2018) for ROB.  Vena Austria, MD, [facility], [facility]

## 2018-02-27 ENCOUNTER — Other Ambulatory Visit: Payer: Self-pay

## 2018-02-27 ENCOUNTER — Observation Stay
Admission: EM | Admit: 2018-02-27 | Discharge: 2018-02-27 | Disposition: A | Discharge status: Home / Self Care | Payer: Medicaid Other | Attending: Certified Nurse Midwife | Admitting: Certified Nurse Midwife

## 2018-02-27 DIAGNOSIS — O0990 Supervision of high risk pregnancy, unspecified, unspecified trimester: Secondary | ICD-10-CM

## 2018-02-27 DIAGNOSIS — Z3A35 35 weeks gestation of pregnancy: Secondary | ICD-10-CM | POA: Diagnosis not present

## 2018-02-27 DIAGNOSIS — M545 Low back pain: Secondary | ICD-10-CM

## 2018-02-27 DIAGNOSIS — N898 Other specified noninflammatory disorders of vagina: Secondary | ICD-10-CM | POA: Diagnosis present

## 2018-02-27 DIAGNOSIS — O26893 Other specified pregnancy related conditions, third trimester: Principal | ICD-10-CM | POA: Insufficient documentation

## 2018-02-27 DIAGNOSIS — R112 Nausea with vomiting, unspecified: Secondary | ICD-10-CM | POA: Insufficient documentation

## 2018-02-27 DIAGNOSIS — O212 Late vomiting of pregnancy: Secondary | ICD-10-CM

## 2018-02-27 MED ORDER — ONDANSETRON 4 MG PO TBDP
4.0000 mg | ORAL_TABLET | Freq: Four times a day (QID) | ORAL | Status: DC | PRN
  Administered 2018-02-27: 4 mg via ORAL

## 2018-02-27 MED ORDER — ONDANSETRON 4 MG PO TBDP
ORAL_TABLET | ORAL | Status: AC
  Filled 2018-02-27: qty 1

## 2018-02-27 NOTE — OB Triage Note (Signed)
Pt is a 23 y.o G1P0 that presents from the ED c/o LOF at 1900 tonight. Pt states the fluid was clear and she soaked panties and it was running down her legs. Pt states she has back pain rated 6/10 and had 2 incidents of diarrhea after LOF and constant Nausea. Pt denies VB, and states Positive FM. Monitors applied and assessing with initial FHT 140.

## 2018-02-27 NOTE — OB Triage Note (Signed)
Discharge instructions provided and reviewed.  Follow up care discussed.  Pain management options reviewed.  Pt verbalizes understanding.

## 2018-02-27 NOTE — Final Progress Note (Signed)
Physician Final Progress Note  Patient ID: Stacey Donovan MRN: [id number] DOB/AGE: 22-02-1995 22 y.o.  Admit date: 02/27/2018 Admitting provider: Vena Austria, MD/ Gasper Lloyd. Sharen Hones, CNM Discharge date: 03/02/2018   Admission Diagnoses: IUP at 35 weeks with leakage of fluid and back pain  Discharge Diagnoses:  IUP at 35 weeks  No evidence of ruptured membranes Lower back pain in pregnancy Nausea and vomiting of pregnancy.  Consults: None  Significant Findings/ Diagnostic Studies: 23 year old G1 P0 with EDC 04/03/2018 presents at 35 weeks with complaints of a gush of clear fluid which ran down her leg when she was dry heaving around 1900 tonight. She has had intermittent nausea and vomiting with her pregnancy and some  loose stools over the last week. No fever. Baby active. No vaginal bleeding. Seen in L&D 5 days ago for similar complaints including contractions. Cervix was 1/75%/-1 at that time. Rates back pain at 6/10, it is constant, in the sacral area. Has been present for over a week. 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 Antibody:Negative (07/30 1346)  Anatomic [place] Normal anatomy, anterior placenta Rubella: 1.52 (07/30 1346) Varicella: Immune  GTT Third trimester:  RPR: Non Reactive (07/30 1346)   Rhogam NA HBsAg: Negative (07/30 1346)   TDaP vaccine      01/26/18                 Flu Shot: HIV: Non Reactive (07/30 1346)   Baby Food                                GBS:   Contraception  Pap:  CBB     CS/VBAC    Support Person Boyfriend [name]    Exam: BP 113/62 (BP Location: Left Arm)   Pulse (!) 102   Temp 98.1 F (36.7 C) (Oral)   Resp 18   Ht 5\' 2"  (1.575 m)   Wt 70.8 kg   LMP 06/21/2017 (Approximate)   BMI 28.53 kg/m   General: gravid WF in NAD Respiratory/  Chest: normal respiratory effort Abdomen: soft, gravid Pelvic exam: SSE: External/BUS: dry, no lesions or inflammation  Vagina: scant white mucoepithelial discharge  Wet prep: negative hyphae , Trich, clue cells  No ferning seen, no pooling FHR: 135 with accelerations to 160s, moderate variability Toco: uterine irritability, occasional contraction Back: tenderness in left SI joint Cervix: 1/75%/-1/ cephalic  A: IUP at 35 weeks with no evidence of rupture of membranes Nausea and vomiting of pregnancy Sacral back pain due to MSK etiology  P: Zofran 4 mgm ODT Recommend heat or ice for sacral pain, Biofreeze, Tylenol Patient discharged per her request Labor precautions FU at [facility] for ROB   Procedures: none  Discharge Condition: stable  Disposition:   Diet: as tolerated  Discharge Activity: Activity as tolerated   Allergies as of 02/27/2018   No Known Allergies     Medication List    TAKE these medications   hydrOXYzine 25 MG tablet Commonly known as:  ATARAX/VISTARIL Take 1 tablet (25 mg total) by mouth every 6 (six) hours as needed for itching.   multivitamin-prenatal 27-0.8 MG Tabs tablet Take 1 tablet by mouth daily at 12 noon.   triamcinolone cream 0.1 % Commonly known as:  KENALOG Apply 1 application topically 2 (two) times daily.      Follow-up Information    [facility], [place]. Go to.   Why:  Keep regularly scheduled appointments Contact information: [address] [place] [address] [phone]           Total time spent taking care of this patient: 15 minutes  Signed: Farrel Conners, CNM

## 2018-03-08 ENCOUNTER — Encounter: Payer: Medicaid Other | Admitting: Obstetrics and Gynecology

## 2018-03-15 ENCOUNTER — Encounter: Payer: Medicaid Other | Admitting: Obstetrics and Gynecology

## 2018-03-17 ENCOUNTER — Other Ambulatory Visit (HOSPITAL_COMMUNITY)
Admission: RE | Admit: 2018-03-17 | Discharge: 2018-03-17 | Disposition: A | Discharge status: Home / Self Care | Payer: Medicaid Other | Source: Ambulatory Visit | Attending: Obstetrics and Gynecology | Admitting: Obstetrics and Gynecology

## 2018-03-17 ENCOUNTER — Ambulatory Visit (INDEPENDENT_AMBULATORY_CARE_PROVIDER_SITE_OTHER): Payer: Medicaid Other | Admitting: Maternal Newborn

## 2018-03-17 ENCOUNTER — Encounter: Payer: Self-pay | Admitting: Maternal Newborn

## 2018-03-17 VITALS — BP 116/70 | Wt 157.0 lb

## 2018-03-17 DIAGNOSIS — O0990 Supervision of high risk pregnancy, unspecified, unspecified trimester: Secondary | ICD-10-CM | POA: Diagnosis not present

## 2018-03-17 DIAGNOSIS — M549 Dorsalgia, unspecified: Secondary | ICD-10-CM

## 2018-03-17 DIAGNOSIS — Z3A37 37 weeks gestation of pregnancy: Secondary | ICD-10-CM

## 2018-03-17 DIAGNOSIS — O26893 Other specified pregnancy related conditions, third trimester: Secondary | ICD-10-CM

## 2018-03-17 LAB — POCT URINALYSIS DIPSTICK OB: Glucose, UA: NEGATIVE

## 2018-03-17 NOTE — Progress Notes (Signed)
Routine Prenatal Care Visit  Subjective  Stacey Donovan is a 23 y.o. G1P0 at [redacted]w[redacted]d being seen today for ongoing prenatal care.  She is currently monitored for the following issues for this high-risk pregnancy and has Supervision of high risk pregnancy, antepartum; Pregnancy related hip pain, antepartum; and Indication for care in labor and delivery, antepartum on their problem list.  ----------------------------------------------------------------------------------- Patient reports lots of pressure and back and rib pain. Baby has been less active over the past 2 days.  Contractions: Irregular. Vag. Bleeding: None.  Movement: Present. No leaking of fluid.  ----------------------------------------------------------------------------------- The following portions of the patient's history were reviewed and updated as appropriate: allergies, current medications, past family history, past medical history, past social history, past surgical history and problem list. Problem list updated.  Objective  Blood pressure 116/70, weight 157 lb (71.2 kg), last menstrual period 06/21/2017. Pregravid weight 109 lb (49.4 kg) Total Weight Gain 48 lb (21.8 kg) Body mass index is 28.72 kg/m.   Urinalysis: Urine dipstick shows negative for glucose, positive for protein (trace).  Fetal Status: Fetal Heart Rate (bpm): 143 Fundal Height: 38 cm Movement: Present     General:  Alert, oriented and cooperative. Patient is in no acute distress.  Skin: Skin is warm and dry. No rash noted.   Cardiovascular: Normal heart rate noted  Respiratory: Normal respiratory effort, no problems with respiration noted  Abdomen: Soft, gravid, appropriate for gestational age. Pain/Pressure: Present     Pelvic:  Cervical exam deferred        Extremities: Normal range of motion.     Mental Status: Normal mood and affect. Normal behavior. Normal judgment and thought content.    Assessment   23 y.o. G1P0 at [redacted]w[redacted]d, EDD  04/03/2018 by Ultrasound presenting for a routine prenatal visit.  Plan   Pregnancy#1 Problems (from 06/21/17 to present)    Problem Noted Resolved   Supervision of high risk pregnancy, antepartum 08/11/2017 by Tresea Mall, CNM No   Overview Addendum 10/07/2017  1:45 PM by Oswaldo Conroy, CNM    [facility] Prenatal Labs  Dating [place] Blood type:A POS(06/29 [id number])   Genetic Screen NIPS: Antibody:Negative (07/30 1346)  Anatomic [place]  Rubella: 1.52 (07/30 1346) Varicella: Immune  GTT Third trimester:  RPR: Non Reactive (07/30 1346)   Rhogam  HBsAg: Negative (07/30 1346)   TDaP vaccine                       Flu Shot: HIV: Non Reactive (07/30 1346)   Baby Food                                GBS:   Contraception  Pap:  CBB     CS/VBAC    Support Person Boyfriend Brandon            Good fetal movement appreciated during exam and following Doppler auscultation. While this is reassuring, we discussed that she should go to triage for monitoring with any further concerns for decreased fetal movement.   Baby is in a posterior position, discussed exercises to encourage rotation to anterior.  Term labor symptoms and general obstetric precautions including but not limited to vaginal bleeding, contractions, leaking of fluid and fetal movement were reviewed.  Please refer to After Visit Summary for other counseling recommendations.   Return in about 1 week (around 03/24/2018) for ROB.  Marcelyn Bruins, CNM 03/17/2018

## 2018-03-17 NOTE — Patient Instructions (Signed)

## 2018-03-17 NOTE — Progress Notes (Signed)
ROB/GBS- hasnt felt baby too active the last 2 days

## 2018-03-18 LAB — OB RESULTS CONSOLE GC/CHLAMYDIA: Gonorrhea: NEGATIVE

## 2018-03-20 LAB — STREP GP B NAA: Strep Gp B NAA: NEGATIVE

## 2018-03-20 LAB — CERVICOVAGINAL ANCILLARY ONLY
CHLAMYDIA, DNA PROBE: NEGATIVE
Neisseria Gonorrhea: NEGATIVE

## 2018-03-22 ENCOUNTER — Other Ambulatory Visit: Payer: Self-pay

## 2018-03-22 ENCOUNTER — Inpatient Hospital Stay
Admission: EM | Admit: 2018-03-22 | Discharge: 2018-03-22 | Disposition: A | Discharge status: Home / Self Care | Payer: Self-pay | Admitting: Obstetrics and Gynecology

## 2018-03-22 ENCOUNTER — Encounter: Payer: Self-pay | Admitting: *Deleted

## 2018-03-22 ENCOUNTER — Observation Stay
Admission: EM | Admit: 2018-03-22 | Discharge: 2018-03-22 | Disposition: A | Discharge status: Home / Self Care | Payer: Medicaid Other | Source: Home / Self Care | Admitting: Obstetrics and Gynecology

## 2018-03-22 DIAGNOSIS — O0990 Supervision of high risk pregnancy, unspecified, unspecified trimester: Secondary | ICD-10-CM

## 2018-03-22 DIAGNOSIS — O212 Late vomiting of pregnancy: Secondary | ICD-10-CM | POA: Diagnosis not present

## 2018-03-22 DIAGNOSIS — R112 Nausea with vomiting, unspecified: Secondary | ICD-10-CM | POA: Insufficient documentation

## 2018-03-22 DIAGNOSIS — R109 Unspecified abdominal pain: Secondary | ICD-10-CM

## 2018-03-22 DIAGNOSIS — Z3A28 28 weeks gestation of pregnancy: Secondary | ICD-10-CM

## 2018-03-22 DIAGNOSIS — O26893 Other specified pregnancy related conditions, third trimester: Secondary | ICD-10-CM

## 2018-03-22 DIAGNOSIS — Z87891 Personal history of nicotine dependence: Secondary | ICD-10-CM

## 2018-03-22 DIAGNOSIS — Z3A38 38 weeks gestation of pregnancy: Secondary | ICD-10-CM

## 2018-03-22 HISTORY — DX: Anxiety disorder, unspecified: F41.9

## 2018-03-22 MED ORDER — ONDANSETRON 8 MG PO TBDP: 8.0000 mg | ORAL_TABLET | Freq: Three times a day (TID) | ORAL | 0 refills | Status: DC | PRN

## 2018-03-22 MED ORDER — ONDANSETRON 4 MG PO TBDP
8.0000 mg | ORAL_TABLET | ORAL | Status: AC
  Administered 2018-03-22: 8 mg via ORAL
  Filled 2018-03-22: qty 2

## 2018-03-22 NOTE — Discharge Summary (Signed)
Physician Final Progress Note  Patient ID: Stacey Donovan MRN: [id number] DOB/AGE: 1995/12/22 22 y.o.  Admit date: 03/22/2018 Admitting provider: Vena AustriaAndreas Corsica Franson, MD Discharge date: 03/22/2018   Admission Diagnoses: Contractions  Discharge Diagnoses:  Active Problems:   Braxton Hick's contraction  22 y.o. G1P0 at 8924w2d presenting with contractions.  Seen earlier today and noted to be 1.4177m, stable of re-check.  Went home and had intercourse and now with stronger contractions and some light spotting.  No bleeding on exam.  +FM, no LOF.  Cervix stable with reassuring fetal monitoring and normotensive.  Blood pressure 108/86, pulse 91, temperature 98.3 F (36.8 C), temperature source Oral, resp. rate 16, last menstrual period 06/21/2017.   Consults: none  Significant Findings/ Diagnostic Studies: none  Procedures:  Baseline: 135 Variability: moderate Accelerations: present Decelerations: absent Tocometry: irregular, every 6-8 minutes The patient was monitored for 30 minutes, fetal heart rate tracing was deemed reactive, category I tracing,  Discharge Condition: good  Disposition: Discharge disposition: 01-Home or Self Care       Diet: Regular diet  Discharge Activity: Activity as tolerated  Discharge Instructions    Discharge activity:  No Restrictions   Complete by:  As directed    Discharge diet:  No restrictions   Complete by:  As directed    Fetal Kick Count:  Lie on our left side for one hour after a meal, and count the number of times your baby kicks.  If it is less than 5 times, get up, move around and drink some juice.  Repeat the test 30 minutes later.  If it is still less than 5 kicks in an hour, notify your doctor.   Complete by:  As directed    LABOR:  When conractions begin, you should start to time them from the beginning of one contraction to the beginning  of the next.  When contractions are 5 - 10 minutes apart or less and have been regular for  at least an hour, you should call your health care provider.   Complete by:  As directed    No sexual activity restrictions   Complete by:  As directed    Notify physician for bleeding from the vagina   Complete by:  As directed    Notify physician for blurring of vision or spots before the eyes   Complete by:  As directed    Notify physician for chills or fever   Complete by:  As directed    Notify physician for fainting spells, "black outs" or loss of consciousness   Complete by:  As directed    Notify physician for increase in vaginal discharge   Complete by:  As directed    Notify physician for leaking of fluid   Complete by:  As directed    Notify physician for pain or burning when urinating   Complete by:  As directed    Notify physician for pelvic pressure (sudden increase)   Complete by:  As directed    Notify physician for severe or continued nausea or vomiting   Complete by:  As directed    Notify physician for sudden gushing of fluid from the vagina (with or without continued leaking)   Complete by:  As directed    Notify physician for sudden, constant, or occasional abdominal pain   Complete by:  As directed    Notify physician if baby moving less than usual   Complete by:  As directed      Allergies  as of 03/22/2018   No Known Allergies     Medication List    TAKE these medications   multivitamin-prenatal 27-0.8 MG Tabs tablet Take 1 tablet by mouth daily at 12 noon.   ondansetron 8 MG disintegrating tablet Commonly known as:  ZOFRAN-ODT Take 1 tablet (8 mg total) by mouth every 8 (eight) hours as needed for nausea or vomiting.        Total time spent taking care of this patient: 30 minutes  Signed: Vena Austria 03/22/2018, 6:46 PM

## 2018-03-22 NOTE — Final Progress Note (Signed)
Physician Final Progress Note  Patient ID: Stacey Donovan MRN: [id number] DOB/AGE: [birth year] 21 y.o.  Admit date: 03/22/2018 Admitting provider: Conard NovakStephen D Layla Kesling, MD Discharge date: 03/22/2018   Admission Diagnoses:  1) intrauterine pregnancy at 6546w2d  2) abdominal pain, third trimester - concern for labor 3) nausea and emesis in pregnancy  Discharge Diagnoses:  1) intrauterine pregnancy at 8046w2d  2) abdominal pain, third trimester - no evidence of labor 3) nausea and emesis in pregnancy  History of Present Illness: The patient is a 23 y.o. female G1P0 at 6946w2d who presents for regular uterine contractions since early this morning. She also noted nausea at the same time.  She notes +FM, no LOF, and no vaginal bleeding.   Past Medical History:  Diagnosis Date  . Acute appendicitis with generalized peritonitis   . Anxiety   . Appendicitis 09/04/2016  . Hypoglycemia     Past Surgical History:  Procedure Laterality Date  . APPENDECTOMY    . LAPAROSCOPIC APPENDECTOMY N/A 09/04/2016   Procedure: APPENDECTOMY LAPAROSCOPIC;  Surgeon: Ricarda FrameWoodham, Charles, MD;  Location: [facility] ORS;  Service: General;  Laterality: N/A;    No current facility-administered medications on file prior to encounter.    Current Outpatient Medications on File Prior to Encounter  Medication Sig Dispense Refill  . Prenatal Vit-Fe Fumarate-FA (MULTIVITAMIN-PRENATAL) 27-0.8 MG TABS tablet Take 1 tablet by mouth daily at 12 noon.      No Known Allergies  Social History   Socioeconomic History  . Marital status: Single    Spouse name: Not on file  . Number of children: Not on file  . Years of education: Not on file  . Highest education level: Not on file  Occupational History  . Not on file  Social Needs  . Financial resource strain: Not on file  . Food insecurity:    Worry: Not on file    Inability: Not on file  . Transportation needs:    Medical: Not on file    Non-medical: Not on file   Tobacco Use  . Smoking status: Former Smoker    Packs/day: 1.00    Types: Cigarettes  . Smokeless tobacco: Never Used  Substance and Sexual Activity  . Alcohol use: No  . Drug use: Not Currently    Types: Marijuana  . Sexual activity: Not on file  Lifestyle  . Physical activity:    Days per week: Not on file    Minutes per session: Not on file  . Stress: Not on file  Relationships  . Social connections:    Talks on phone: Not on file    Gets together: Not on file    Attends religious service: Not on file    Active member of club or organization: Not on file    Attends meetings of clubs or organizations: Not on file    Relationship status: Not on file  . Intimate partner violence:    Fear of current or ex partner: Not on file    Emotionally abused: Not on file    Physically abused: Not on file    Forced sexual activity: Not on file  Other Topics Concern  . Not on file  Social History Narrative  . Not on file   Family History: denies history of gynecologic cancers  Review of Systems  Constitutional: Negative.   HENT: Negative.   Eyes: Negative.   Respiratory: Negative.   Cardiovascular: Negative.   Gastrointestinal: Positive for abdominal pain, heartburn, nausea and vomiting. Negative for  blood in stool, constipation, diarrhea and melena.  Genitourinary: Negative.   Musculoskeletal: Negative.   Skin: Negative.   Neurological: Negative.   Psychiatric/Behavioral: Negative.      Physical Exam: BP 133/78   Pulse 88   Temp 98.2 F (36.8 C) (Oral)   Ht 5\' 2"  (1.575 m)   Wt 73.9 kg   LMP 06/21/2017 (Approximate)   BMI 29.81 kg/m   Physical Exam Constitutional:      General: She is not in acute distress.    Appearance: Normal appearance.  HENT:     Head: Normocephalic and atraumatic.  Eyes:     General: No scleral icterus.    Conjunctiva/sclera: Conjunctivae normal.  Neurological:     General: No focal deficit present.     Mental Status: She is alert and  oriented to person, place, and time.     Cranial Nerves: No cranial nerve deficit.  Psychiatric:        Mood and Affect: Mood normal.        Behavior: Behavior normal.        Judgment: Judgment normal.   Cervical exam per RN: 1.5 cm and unchanged over 2 hours.   Consults: None  Significant Findings/ Diagnostic Studies: none  Procedures:  NST Baseline FHR: 135 beats/min Variability: moderate Accelerations: present Decelerations: absent Tocometry: irregular, 2 q 10 min  Interpretation:  INDICATIONS: rule out uterine contractions RESULTS:  A NST procedure was performed with FHR monitoring and a normal baseline established, appropriate time of 20-40 minutes of evaluation, and accels >2 seen w 15x15 characteristics.  Results show a REACTIVE NST.    Hospital Course: The patient was admitted to Labor and Delivery Triage for observation. She initially had a fetal tracing that showed tachycardia. However, over the next three hours the fetal baseline was totally normal with reactive characteristics.  Her vitals were normal. She was given zofran for nausea. Her cervical exam was stable. Given the above, she was offered to stay for a longer interval and be rechecked versus going home and coming back, if things got worse.  She elected to be discharged.    Discharge Condition: stable  Disposition: Discharge disposition: 01-Home or Self Care       Diet: Regular diet  Discharge Activity: Activity as tolerated   Allergies as of 03/22/2018   No Known Allergies     Medication List    TAKE these medications   multivitamin-prenatal 27-0.8 MG Tabs tablet Take 1 tablet by mouth daily at 12 noon.   ondansetron 8 MG disintegrating tablet Commonly known as:  ZOFRAN-ODT Take 1 tablet (8 mg total) by mouth every 8 (eight) hours as needed for nausea or vomiting.        Total time spent taking care of this patient: 20 minutes  Signed: Thomasene Mohair, MD  03/22/2018, 9:23 AM

## 2018-03-22 NOTE — OB Triage Note (Signed)
Pt. presents to L&D with ctx's that became more regular beginning around 4 p.m.  She states they are approximately 4-5 minutes apart.  Denies leaking of fluid, positive for fetal movement.  States that vaginal bleeding has gone from spotting to heavier, resembling a menstrual period.

## 2018-03-22 NOTE — OB Triage Note (Signed)
Pt arrived from home with complaints of contractions since around 4 am. Pt denies any leaking of fluid or vaginal bleeding. Pt reports a decrease in fetal movement. EFM and TOCO applied

## 2018-03-22 NOTE — Discharge Summary (Signed)
See Final Progress Note 

## 2018-03-23 ENCOUNTER — Inpatient Hospital Stay: Payer: Medicaid Other | Admitting: Anesthesiology

## 2018-03-23 ENCOUNTER — Inpatient Hospital Stay
Admission: EM | Admit: 2018-03-23 | Discharge: 2018-03-25 | DRG: 787 | Disposition: A | Discharge status: Home / Self Care | Payer: Medicaid Other | Attending: Obstetrics & Gynecology | Admitting: Obstetrics & Gynecology

## 2018-03-23 ENCOUNTER — Encounter
Admission: EM | Disposition: A | Discharge status: Home / Self Care | Payer: Self-pay | Source: Home / Self Care | Attending: Obstetrics & Gynecology

## 2018-03-23 DIAGNOSIS — Z98891 History of uterine scar from previous surgery: Secondary | ICD-10-CM

## 2018-03-23 DIAGNOSIS — Z87891 Personal history of nicotine dependence: Secondary | ICD-10-CM

## 2018-03-23 DIAGNOSIS — O9081 Anemia of the puerperium: Secondary | ICD-10-CM | POA: Diagnosis not present

## 2018-03-23 DIAGNOSIS — O3400 Maternal care for unspecified congenital malformation of uterus, unspecified trimester: Secondary | ICD-10-CM

## 2018-03-23 DIAGNOSIS — Z3483 Encounter for supervision of other normal pregnancy, third trimester: Secondary | ICD-10-CM | POA: Diagnosis present

## 2018-03-23 DIAGNOSIS — Q513 Bicornate uterus: Secondary | ICD-10-CM | POA: Diagnosis not present

## 2018-03-23 DIAGNOSIS — O339 Maternal care for disproportion, unspecified: Secondary | ICD-10-CM | POA: Diagnosis present

## 2018-03-23 DIAGNOSIS — D62 Acute posthemorrhagic anemia: Secondary | ICD-10-CM | POA: Diagnosis not present

## 2018-03-23 DIAGNOSIS — Z3A38 38 weeks gestation of pregnancy: Secondary | ICD-10-CM

## 2018-03-23 DIAGNOSIS — O3403 Maternal care for unspecified congenital malformation of uterus, third trimester: Secondary | ICD-10-CM | POA: Diagnosis present

## 2018-03-23 LAB — URINE DRUG SCREEN, QUALITATIVE (ARMC ONLY)
Amphetamines, Ur Screen: NOT DETECTED
BARBITURATES, UR SCREEN: NOT DETECTED
Benzodiazepine, Ur Scrn: NOT DETECTED
Cannabinoid 50 Ng, Ur ~~LOC~~: POSITIVE — AB
Cocaine Metabolite,Ur ~~LOC~~: NOT DETECTED
MDMA (Ecstasy)Ur Screen: NOT DETECTED
Methadone Scn, Ur: NOT DETECTED
Opiate, Ur Screen: NOT DETECTED
Phencyclidine (PCP) Ur S: NOT DETECTED
Tricyclic, Ur Screen: NOT DETECTED

## 2018-03-23 LAB — CHLAMYDIA/NGC RT PCR (ARMC ONLY)
Chlamydia Tr: NOT DETECTED
N gonorrhoeae: NOT DETECTED

## 2018-03-23 LAB — CBC
HCT: 30.3 % — ABNORMAL LOW (ref 36.0–46.0)
Hemoglobin: 10.1 g/dL — ABNORMAL LOW (ref 12.0–15.0)
MCH: 26.8 pg (ref 26.0–34.0)
MCHC: 33.3 g/dL (ref 30.0–36.0)
MCV: 80.4 fL (ref 80.0–100.0)
NRBC: 0 % (ref 0.0–0.2)
Platelets: 315 10*3/uL (ref 150–400)
RBC: 3.77 MIL/uL — ABNORMAL LOW (ref 3.87–5.11)
RDW: 13.4 % (ref 11.5–15.5)
WBC: 8 10*3/uL (ref 4.0–10.5)

## 2018-03-23 LAB — TYPE AND SCREEN
ABO/RH(D): A POS
Antibody Screen: NEGATIVE

## 2018-03-23 SURGERY — Surgical Case
Anesthesia: Epidural | Site: Abdomen

## 2018-03-23 MED ORDER — BUPIVACAINE HCL 0.5 % IJ SOLN
10.0000 mL | Freq: Once | INTRAMUSCULAR | Status: DC
  Filled 2018-03-23: qty 10

## 2018-03-23 MED ORDER — KETOROLAC TROMETHAMINE 30 MG/ML IJ SOLN
30.0000 mg | Freq: Four times a day (QID) | INTRAMUSCULAR | Status: AC
  Administered 2018-03-24 (×2): 30 mg via INTRAVENOUS
  Filled 2018-03-23 (×3): qty 1

## 2018-03-23 MED ORDER — OXYTOCIN 40 UNITS IN NORMAL SALINE INFUSION - SIMPLE MED
2.5000 [IU]/h | INTRAVENOUS | Status: AC
  Administered 2018-03-23: 2.5 [IU]/h via INTRAVENOUS
  Filled 2018-03-23: qty 1000

## 2018-03-23 MED ORDER — DIPHENHYDRAMINE HCL 50 MG/ML IJ SOLN: 12.5000 mg | INTRAMUSCULAR | Status: DC | PRN

## 2018-03-23 MED ORDER — BUTORPHANOL TARTRATE 2 MG/ML IJ SOLN
1.0000 mg | INTRAMUSCULAR | Status: DC | PRN
  Administered 2018-03-23 (×2): 1 mg via INTRAVENOUS
  Filled 2018-03-23 (×2): qty 1

## 2018-03-23 MED ORDER — MORPHINE SULFATE (PF) 0.5 MG/ML IJ SOLN
INTRAMUSCULAR | Status: AC
  Filled 2018-03-23: qty 10

## 2018-03-23 MED ORDER — TERBUTALINE SULFATE 1 MG/ML IJ SOLN: 0.2500 mg | Freq: Once | INTRAMUSCULAR | Status: DC | PRN

## 2018-03-23 MED ORDER — DIBUCAINE 1 % RE OINT: 1.0000 "application " | TOPICAL_OINTMENT | RECTAL | Status: DC | PRN

## 2018-03-23 MED ORDER — WITCH HAZEL-GLYCERIN EX PADS: 1.0000 "application " | MEDICATED_PAD | CUTANEOUS | Status: DC | PRN

## 2018-03-23 MED ORDER — LIDOCAINE-EPINEPHRINE (PF) 2 %-1:200000 IJ SOLN
INTRAMUSCULAR | Status: DC | PRN
  Administered 2018-03-23: 5 mL via EPIDURAL
  Administered 2018-03-23 (×2): 3 mL via EPIDURAL
  Administered 2018-03-23 (×2): 5 mL via EPIDURAL

## 2018-03-23 MED ORDER — OXYTOCIN 40 UNITS IN NORMAL SALINE INFUSION - SIMPLE MED
INTRAVENOUS | Status: DC | PRN
  Administered 2018-03-23: 500 mL via INTRAVENOUS

## 2018-03-23 MED ORDER — MENTHOL 3 MG MT LOZG
1.0000 | LOZENGE | OROMUCOSAL | Status: DC | PRN
  Filled 2018-03-23: qty 9

## 2018-03-23 MED ORDER — LIDOCAINE-EPINEPHRINE (PF) 1.5 %-1:200000 IJ SOLN
INTRAMUSCULAR | Status: DC | PRN
  Administered 2018-03-23: 3 mL via PERINEURAL

## 2018-03-23 MED ORDER — EPHEDRINE 5 MG/ML INJ: 10.0000 mg | INTRAVENOUS | Status: DC | PRN

## 2018-03-23 MED ORDER — OXYCODONE-ACETAMINOPHEN 5-325 MG PO TABS
1.0000 | ORAL_TABLET | ORAL | Status: DC | PRN
  Administered 2018-03-24: 1 via ORAL
  Administered 2018-03-24: 2 via ORAL
  Administered 2018-03-24: 1 via ORAL
  Administered 2018-03-25 (×2): 2 via ORAL
  Filled 2018-03-23: qty 2
  Filled 2018-03-23: qty 1
  Filled 2018-03-23: qty 2
  Filled 2018-03-23: qty 1
  Filled 2018-03-23: qty 2

## 2018-03-23 MED ORDER — ONDANSETRON HCL 4 MG/2ML IJ SOLN
4.0000 mg | Freq: Four times a day (QID) | INTRAMUSCULAR | Status: DC | PRN
  Administered 2018-03-23 (×2): 4 mg via INTRAVENOUS
  Filled 2018-03-23 (×2): qty 2

## 2018-03-23 MED ORDER — LIDOCAINE HCL (PF) 1 % IJ SOLN
INTRAMUSCULAR | Status: DC | PRN
  Administered 2018-03-23: 3 mL

## 2018-03-23 MED ORDER — MEPERIDINE HCL 25 MG/ML IJ SOLN: 6.2500 mg | INTRAMUSCULAR | Status: DC | PRN

## 2018-03-23 MED ORDER — DIPHENHYDRAMINE HCL 25 MG PO CAPS: 25.0000 mg | ORAL_CAPSULE | ORAL | Status: DC | PRN

## 2018-03-23 MED ORDER — KETOROLAC TROMETHAMINE 30 MG/ML IJ SOLN
30.0000 mg | Freq: Four times a day (QID) | INTRAMUSCULAR | Status: DC
  Administered 2018-03-23: 30 mg via INTRAVENOUS
  Filled 2018-03-23 (×2): qty 1

## 2018-03-23 MED ORDER — LACTATED RINGERS IV SOLN
INTRAVENOUS | Status: DC
  Administered 2018-03-23 (×3): via INTRAVENOUS

## 2018-03-23 MED ORDER — ONDANSETRON HCL 4 MG/2ML IJ SOLN: 4.0000 mg | Freq: Three times a day (TID) | INTRAMUSCULAR | Status: DC | PRN

## 2018-03-23 MED ORDER — AMMONIA AROMATIC IN INHA
RESPIRATORY_TRACT | Status: AC
  Filled 2018-03-23: qty 10

## 2018-03-23 MED ORDER — ACETAMINOPHEN 325 MG PO TABS
650.0000 mg | ORAL_TABLET | Freq: Four times a day (QID) | ORAL | Status: DC
  Administered 2018-03-23: 650 mg via ORAL
  Filled 2018-03-23: qty 2

## 2018-03-23 MED ORDER — FENTANYL CITRATE (PF) 100 MCG/2ML IJ SOLN
INTRAMUSCULAR | Status: DC | PRN
  Administered 2018-03-23: 100 ug via EPIDURAL

## 2018-03-23 MED ORDER — NALBUPHINE HCL 10 MG/ML IJ SOLN: 5.0000 mg | INTRAMUSCULAR | Status: DC | PRN

## 2018-03-23 MED ORDER — MORPHINE SULFATE (PF) 0.5 MG/ML IJ SOLN
INTRAMUSCULAR | Status: DC | PRN
  Administered 2018-03-23: 3 mg via EPIDURAL

## 2018-03-23 MED ORDER — DEXTROSE 5 % IV SOLN
INTRAVENOUS | Status: DC | PRN
  Administered 2018-03-23: 2 g via INTRAVENOUS

## 2018-03-23 MED ORDER — LIDOCAINE HCL (PF) 1 % IJ SOLN: 30.0000 mL | INTRAMUSCULAR | Status: DC | PRN

## 2018-03-23 MED ORDER — SODIUM CHLORIDE 0.9 % IV SOLN
INTRAVENOUS | Status: AC
  Filled 2018-03-23: qty 2

## 2018-03-23 MED ORDER — SIMETHICONE 80 MG PO CHEW
80.0000 mg | CHEWABLE_TABLET | ORAL | Status: DC
  Administered 2018-03-24 (×2): 80 mg via ORAL

## 2018-03-23 MED ORDER — OXYTOCIN 10 UNIT/ML IJ SOLN
INTRAMUSCULAR | Status: AC
  Filled 2018-03-23: qty 2

## 2018-03-23 MED ORDER — NALOXONE HCL 0.4 MG/ML IJ SOLN: 0.4000 mg | INTRAMUSCULAR | Status: DC | PRN

## 2018-03-23 MED ORDER — COCONUT OIL OIL: 1.0000 "application " | TOPICAL_OIL | Status: DC | PRN

## 2018-03-23 MED ORDER — ONDANSETRON HCL 4 MG/2ML IJ SOLN
INTRAMUSCULAR | Status: DC | PRN
  Administered 2018-03-23: 4 mg via INTRAVENOUS

## 2018-03-23 MED ORDER — LACTATED RINGERS IV SOLN
500.0000 mL | INTRAVENOUS | Status: DC | PRN
  Administered 2018-03-23: 1000 mL via INTRAVENOUS

## 2018-03-23 MED ORDER — SCOPOLAMINE 1 MG/3DAYS TD PT72: 1.0000 | MEDICATED_PATCH | Freq: Once | TRANSDERMAL | Status: DC

## 2018-03-23 MED ORDER — NALOXONE HCL 4 MG/10ML IJ SOLN
1.0000 ug/kg/h | INTRAVENOUS | Status: DC | PRN
  Filled 2018-03-23: qty 5

## 2018-03-23 MED ORDER — MORPHINE SULFATE (PF) 2 MG/ML IV SOLN: 1.0000 mg | INTRAVENOUS | Status: DC | PRN

## 2018-03-23 MED ORDER — ROPIVACAINE HCL 2 MG/ML IJ SOLN: 12.0000 mL/h | INTRAMUSCULAR | Status: DC

## 2018-03-23 MED ORDER — SODIUM CHLORIDE 0.9 % IV SOLN
500.0000 mg | Freq: Once | INTRAVENOUS | Status: AC
  Administered 2018-03-23: 500 mg via INTRAVENOUS
  Filled 2018-03-23: qty 500

## 2018-03-23 MED ORDER — NALBUPHINE HCL 10 MG/ML IJ SOLN: 5.0000 mg | Freq: Once | INTRAMUSCULAR | Status: DC | PRN

## 2018-03-23 MED ORDER — OXYTOCIN 40 UNITS IN NORMAL SALINE INFUSION - SIMPLE MED
1.0000 m[IU]/min | INTRAVENOUS | Status: DC
  Administered 2018-03-23 (×2): 1 m[IU]/min via INTRAVENOUS
  Filled 2018-03-23: qty 1000

## 2018-03-23 MED ORDER — SIMETHICONE 80 MG PO CHEW
80.0000 mg | CHEWABLE_TABLET | Freq: Three times a day (TID) | ORAL | Status: DC
  Administered 2018-03-24: 80 mg via ORAL
  Filled 2018-03-23 (×2): qty 1

## 2018-03-23 MED ORDER — OXYCODONE HCL 5 MG PO TABS: 5.0000 mg | ORAL_TABLET | ORAL | Status: DC | PRN

## 2018-03-23 MED ORDER — BUPIVACAINE ON-Q PAIN PUMP (FOR ORDER SET NO CHG)
INJECTION | Status: DC
  Filled 2018-03-23: qty 1

## 2018-03-23 MED ORDER — MISOPROSTOL 200 MCG PO TABS
ORAL_TABLET | ORAL | Status: AC
  Filled 2018-03-23: qty 4

## 2018-03-23 MED ORDER — FENTANYL 2.5 MCG/ML W/ROPIVACAINE 0.15% IN NS 100 ML EPIDURAL (ARMC)
EPIDURAL | Status: DC | PRN
  Administered 2018-03-23: 12 mL/h via EPIDURAL

## 2018-03-23 MED ORDER — OXYCODONE HCL 5 MG PO TABS: 10.0000 mg | ORAL_TABLET | ORAL | Status: DC | PRN

## 2018-03-23 MED ORDER — LACTATED RINGERS IV SOLN: 500.0000 mL | Freq: Once | INTRAVENOUS | Status: DC

## 2018-03-23 MED ORDER — PHENYLEPHRINE 40 MCG/ML (10ML) SYRINGE FOR IV PUSH (FOR BLOOD PRESSURE SUPPORT): 80.0000 ug | PREFILLED_SYRINGE | INTRAVENOUS | Status: DC | PRN

## 2018-03-23 MED ORDER — SOD CITRATE-CITRIC ACID 500-334 MG/5ML PO SOLN
30.0000 mL | ORAL | Status: DC | PRN
  Administered 2018-03-23: 30 mL via ORAL
  Filled 2018-03-23: qty 15

## 2018-03-23 MED ORDER — ACETAMINOPHEN 500 MG PO TABS
1000.0000 mg | ORAL_TABLET | Freq: Four times a day (QID) | ORAL | Status: AC
  Administered 2018-03-24 (×2): 1000 mg via ORAL
  Filled 2018-03-23 (×3): qty 2

## 2018-03-23 MED ORDER — ACETAMINOPHEN 325 MG PO TABS: 650.0000 mg | ORAL_TABLET | ORAL | Status: DC | PRN

## 2018-03-23 MED ORDER — KETOROLAC TROMETHAMINE 30 MG/ML IJ SOLN: 30.0000 mg | Freq: Once | INTRAMUSCULAR | Status: DC

## 2018-03-23 MED ORDER — FENTANYL CITRATE (PF) 100 MCG/2ML IJ SOLN
INTRAMUSCULAR | Status: AC
  Filled 2018-03-23: qty 2

## 2018-03-23 MED ORDER — DIPHENHYDRAMINE HCL 25 MG PO CAPS
25.0000 mg | ORAL_CAPSULE | ORAL | Status: DC | PRN
  Administered 2018-03-23: 25 mg via ORAL
  Filled 2018-03-23: qty 1

## 2018-03-23 MED ORDER — LIDOCAINE HCL (PF) 1 % IJ SOLN
INTRAMUSCULAR | Status: AC
  Filled 2018-03-23: qty 30

## 2018-03-23 MED ORDER — PHENYLEPHRINE HCL 10 MG/ML IJ SOLN
INTRAMUSCULAR | Status: AC
  Filled 2018-03-23: qty 1

## 2018-03-23 MED ORDER — OXYTOCIN BOLUS FROM INFUSION: 500.0000 mL | Freq: Once | INTRAVENOUS | Status: DC

## 2018-03-23 MED ORDER — OXYTOCIN 40 UNITS IN NORMAL SALINE INFUSION - SIMPLE MED
INTRAVENOUS | Status: AC
  Filled 2018-03-23: qty 1000

## 2018-03-23 MED ORDER — KETOROLAC TROMETHAMINE 30 MG/ML IJ SOLN: 30.0000 mg | Freq: Four times a day (QID) | INTRAMUSCULAR | Status: DC

## 2018-03-23 MED ORDER — PRENATAL MULTIVITAMIN CH
1.0000 | ORAL_TABLET | Freq: Every day | ORAL | Status: DC
  Administered 2018-03-24: 1 via ORAL
  Filled 2018-03-23: qty 1

## 2018-03-23 MED ORDER — FENTANYL 2.5 MCG/ML W/ROPIVACAINE 0.15% IN NS 100 ML EPIDURAL (ARMC)
EPIDURAL | Status: AC
  Filled 2018-03-23: qty 100

## 2018-03-23 MED ORDER — SODIUM CHLORIDE 0.9 % IV SOLN: 2.0000 g | INTRAVENOUS | Status: DC

## 2018-03-23 MED ORDER — SOD CITRATE-CITRIC ACID 500-334 MG/5ML PO SOLN: 30.0000 mL | ORAL | Status: DC

## 2018-03-23 MED ORDER — SOD CITRATE-CITRIC ACID 500-334 MG/5ML PO SOLN
ORAL | Status: AC
  Administered 2018-03-23: 30 mL
  Filled 2018-03-23: qty 15

## 2018-03-23 MED ORDER — LACTATED RINGERS IV SOLN
INTRAVENOUS | Status: DC
  Administered 2018-03-23: 19:00:00 via INTRAVENOUS

## 2018-03-23 MED ORDER — SIMETHICONE 80 MG PO CHEW
80.0000 mg | CHEWABLE_TABLET | ORAL | Status: DC | PRN
  Filled 2018-03-23: qty 1

## 2018-03-23 MED ORDER — BUPIVACAINE 0.25 % ON-Q PUMP DUAL CATH 400 ML
400.0000 mL | INJECTION | Status: DC
  Filled 2018-03-23: qty 400

## 2018-03-23 MED ORDER — LACTATED RINGERS IV SOLN: INTRAVENOUS | Status: DC

## 2018-03-23 MED ORDER — DIPHENHYDRAMINE HCL 25 MG PO CAPS: 25.0000 mg | ORAL_CAPSULE | Freq: Four times a day (QID) | ORAL | Status: DC | PRN

## 2018-03-23 MED ORDER — SENNOSIDES-DOCUSATE SODIUM 8.6-50 MG PO TABS
2.0000 | ORAL_TABLET | ORAL | Status: DC
  Administered 2018-03-24: 2 via ORAL
  Filled 2018-03-23: qty 2

## 2018-03-23 MED ORDER — OXYTOCIN 40 UNITS IN NORMAL SALINE INFUSION - SIMPLE MED
2.5000 [IU]/h | INTRAVENOUS | Status: DC
  Administered 2018-03-23: 2.5 [IU]/h via INTRAVENOUS
  Filled 2018-03-23: qty 1000

## 2018-03-23 MED ORDER — ZOLPIDEM TARTRATE 5 MG PO TABS: 5.0000 mg | ORAL_TABLET | Freq: Every evening | ORAL | Status: DC | PRN

## 2018-03-23 MED ORDER — PROPOFOL 10 MG/ML IV BOLUS
INTRAVENOUS | Status: AC
  Filled 2018-03-23: qty 20

## 2018-03-23 MED ORDER — BUPIVACAINE HCL (PF) 0.25 % IJ SOLN
INTRAMUSCULAR | Status: DC | PRN
  Administered 2018-03-23: 10 mL via EPIDURAL

## 2018-03-23 MED ORDER — BUPIVACAINE HCL 0.5 % IJ SOLN
INTRAMUSCULAR | Status: DC | PRN
  Administered 2018-03-23: 10 mL

## 2018-03-23 MED ORDER — LIDOCAINE HCL (PF) 2 % IJ SOLN
INTRAMUSCULAR | Status: AC
  Filled 2018-03-23: qty 10

## 2018-03-23 MED ORDER — BUPIVACAINE HCL (PF) 0.5 % IJ SOLN
INTRAMUSCULAR | Status: AC
  Filled 2018-03-23: qty 30

## 2018-03-23 MED ORDER — SODIUM CHLORIDE 0.9% FLUSH: 3.0000 mL | INTRAVENOUS | Status: DC | PRN

## 2018-03-23 SURGICAL SUPPLY — 24 items
CANISTER SUCT 3000ML PPV (MISCELLANEOUS) ×2 IMPLANT
CATH KIT ON-Q SILVERSOAK 5IN (CATHETERS) ×4 IMPLANT
CHLORAPREP W/TINT 26ML (MISCELLANEOUS) ×4 IMPLANT
COVER WAND RF STERILE (DRAPES) ×2 IMPLANT
DERMABOND ADVANCED (GAUZE/BANDAGES/DRESSINGS) ×2
DERMABOND ADVANCED .7 DNX12 (GAUZE/BANDAGES/DRESSINGS) ×2 IMPLANT
DRSG OPSITE POSTOP 4X10 (GAUZE/BANDAGES/DRESSINGS) ×2 IMPLANT
ELECT CAUTERY BLADE 6.4 (BLADE) ×2 IMPLANT
ELECT REM PT RETURN 9FT ADLT (ELECTROSURGICAL) ×2
ELECTRODE REM PT RTRN 9FT ADLT (ELECTROSURGICAL) ×1 IMPLANT
GLOVE SKINSENSE NS SZ8.0 LF (GLOVE) ×1
GLOVE SKINSENSE STRL SZ8.0 LF (GLOVE) ×1 IMPLANT
GOWN STRL REUS W/ TWL LRG LVL3 (GOWN DISPOSABLE) ×1 IMPLANT
GOWN STRL REUS W/ TWL XL LVL3 (GOWN DISPOSABLE) ×2 IMPLANT
GOWN STRL REUS W/TWL LRG LVL3 (GOWN DISPOSABLE) ×1
GOWN STRL REUS W/TWL XL LVL3 (GOWN DISPOSABLE) ×2
NS IRRIG 1000ML POUR BTL (IV SOLUTION) ×2 IMPLANT
PACK C SECTION AR (MISCELLANEOUS) ×2 IMPLANT
PAD OB MATERNITY 4.3X12.25 (PERSONAL CARE ITEMS) ×2 IMPLANT
PAD PREP 24X41 OB/GYN DISP (PERSONAL CARE ITEMS) ×2 IMPLANT
SUT MAXON ABS #0 GS21 30IN (SUTURE) ×4 IMPLANT
SUT VIC AB 1 CT1 36 (SUTURE) ×6 IMPLANT
SUT VIC AB 2-0 CT1 36 (SUTURE) ×2 IMPLANT
SUT VIC AB 4-0 FS2 27 (SUTURE) ×2 IMPLANT

## 2018-03-23 NOTE — H&P (Addendum)
Obstetric H&P   Chief Complaint: Contraction  Prenatal Care Provider: [facility]  History of Present Illness: 23 y.o. G1P0 [redacted]w[redacted]d by 04/03/2018, by Ultrasound presenting to L&D with worsening contractions.  No LOF, no VB, +FM.  Pregnancy complicated by frequent visits fro preterm contractions in the last month with a personal history of bicornuate uterus.     Pregravid weight 49.4 kg Total Weight Gain 24.5 kg  Pregnancy#1 Problems (from 06/21/17 to present)    Problem Noted Resolved   Bicornuate uterus affecting pregnancy in third trimester, antepartum 03/23/2018 by Vena Austria, MD No   Supervision of high risk pregnancy, antepartum 08/11/2017 by Tresea Mall, CNM No   Overview Addendum 03/23/2018  1:57 AM by Vena Austria, MD    Clinic [facility] Prenatal Labs  Dating 6 week [place] Blood type:A POS(06/29 1917)   Genetic Screen NIPS:  Normal XX Antibody:Negative (07/30 1346)  Anatomic [place] Normal Rubella: 1.52 (07/30 1346) Varicella: Immune  GTT 86 RPR: Non Reactive (07/30 1346)   Rhogam N/A HBsAg: Negative (07/30 1346)   TDaP vaccine 02/22/2018 Flu Shot: 12/01/17 HIV: Non Reactive (07/30 1346)   Baby Food Breast                        GBS: Negative  Contraception  Pap: needs postpartum  CS/VBAC N/A   Support Person Boyfriend Apolinar Junes               Review of Systems: 10 point review of systems negative unless otherwise noted in HPI  Past Medical History: Past Medical History:  Diagnosis Date  . Acute appendicitis with generalized peritonitis   . Anxiety   . Appendicitis 09/04/2016  . Hypoglycemia     Past Surgical History: Past Surgical History:  Procedure Laterality Date  . APPENDECTOMY    . LAPAROSCOPIC APPENDECTOMY N/A 09/04/2016   Procedure: APPENDECTOMY LAPAROSCOPIC;  Surgeon: Ricarda Frame, MD;  Location: [facility] ORS;  Service: General;  Laterality: N/A;    Past Obstetric History: #: 1, Date: None, Sex: None, Weight: None, GA: None, Delivery: None, Apgar1: None,  Apgar5: None, Living: None, Birth Comments: None   Past Gynecologic History:  Family History: History reviewed. No pertinent family history.  Social History: Social History   Socioeconomic History  . Marital status: Single    Spouse name: Not on file  . Number of children: Not on file  . Years of education: Not on file  . Highest education level: Not on file  Occupational History  . Not on file  Social Needs  . Financial resource strain: Not on file  . Food insecurity:    Worry: Not on file    Inability: Not on file  . Transportation needs:    Medical: Not on file    Non-medical: Not on file  Tobacco Use  . Smoking status: Former Smoker    Packs/day: 1.00    Types: Cigarettes  . Smokeless tobacco: Never Used  Substance and Sexual Activity  . Alcohol use: No  . Drug use: Not Currently    Types: Marijuana  . Sexual activity: Not on file  Lifestyle  . Physical activity:    Days per week: Not on file    Minutes per session: Not on file  . Stress: Not on file  Relationships  . Social connections:    Talks on phone: Not on file    Gets together: Not on file    Attends religious service: Not on file  Active member of club or organization: Not on file    Attends meetings of clubs or organizations: Not on file    Relationship status: Not on file  . Intimate partner violence:    Fear of current or ex partner: Not on file    Emotionally abused: Not on file    Physically abused: Not on file    Forced sexual activity: Not on file  Other Topics Concern  . Not on file  Social History Narrative  . Not on file    Medications: Prior to Admission medications   Medication Sig Start Date End Date Taking? Authorizing Provider  Prenatal Vit-Fe Fumarate-FA (MULTIVITAMIN-PRENATAL) 27-0.8 MG TABS tablet Take 1 tablet by mouth daily at 12 noon.   Yes [provider]  ondansetron (ZOFRAN-ODT) 8 MG disintegrating tablet Take 1 tablet (8 mg total) by mouth every 8  (eight) hours as needed for nausea or vomiting. 03/22/18   Conard NovakJackson, Stephen D, MD    Allergies: No Known Allergies  Physical Exam: Vitals: Blood pressure 135/70, pulse 87, temperature 98 F (36.7 C), temperature source Oral, resp. rate 19, height 5\' 2"  (1.575 m), weight 73.9 kg, last menstrual period 06/21/2017.  Urine Dip Protein: N/A  FHT: 150, moderate, +accels, no decels Toco: q623min  General: NAD HEENT: normocephalic, anicteric Pulmonary: No increased work of breathing Cardiovascular: RRR, distal pulses 2+ Abdomen: Gravid, non-tender Leopolds: vtx with ultrasound confirmation given higher incidence of malpresentation in uterine Mullerian anomalies Genitourinary:  Dilation: 3 Effacement (%): 80 Cervical Position: Anterior Station: -1 Presentation: Vertex Exam by:: A. white, RN  Extremities: no edema, erythema, or tenderness Neurologic: Grossly intact Psychiatric: mood appropriate, affect full  Labs: No results found for this or any previous visit (from the past 24 hour(s)).  Assessment: 23 y.o. G1P0 3735w3d by 04/03/2018, by Ultrasound presenting in possible early term labor  Plan:  1) Labor - some cervical change since yesterday.  Will admit.  If further cervical change may have epidural until then IV stadol.  Unless makes it past 4cm no augmentation  2) Fetus - cat I tracing, GBS negative   3) PNL - Blood type A/Positive/-- (07/30 1346) / Anti-bodyscreen Negative (07/30 1346) / Rubella 1.52 (07/30 1346) / Varicella Immune / RPR Non Reactive (12/03 1108) / HBsAg Negative (07/30 1346) / HIV Non Reactive (12/03 1108) / 1-hr OGTT 86 / GBS Negative (02/06 1619)  4) Immunization History -  Immunization History  Administered Date(s) Administered  . Influenza,inj,Quad PF,6+ Mos 12/01/2017  . Tdap 02/22/2018    5) Disposition - pending cervical change  Vena AustriaAndreas Javonn Gauger, MD, Merlinda FrederickFACOG [facility], [facility] 03/23/2018, 1:59 AM

## 2018-03-23 NOTE — Discharge Summary (Signed)
OB Discharge Summary     Patient Name: Stacey Donovan DOB: 1995/10/30 MRN: [id number]  Date of admission: 03/23/2018 Delivering MD: Stacey Libra, MD  Date of Delivery: 03/23/2018  Date of discharge: 03/25/2018  Admitting diagnosis: Pregnancy Intrauterine pregnancy: [redacted]w[redacted]d     Secondary diagnosis: None     Discharge diagnosis: Term Pregnancy Delivered, Reasons for cesarean section  Arrest of Dilation and CPD                         Hospital course:  Onset of Labor With Unplanned C/S  23 y.o. yo G1P0 at [redacted]w[redacted]d was admitted in Latent Labor on 03/23/2018. Patient had a labor course significant for AROM, Pitocin, Epidural. Membrane Rupture Time/Date: 7:43 AM ,03/23/2018   The patient went for cesarean section due to Arrest of Dilation, this at 9.5 cm and even w pushing unable to deliver and delivered a Viable infant in the OP presentation, 03/23/2018  Details of operation can be found in separate operative note. Patient had an uncomplicated postpartum course.  She is ambulating,tolerating a regular diet, passing flatus, and urinating well.  Patient is discharged home in stable condition 03/25/18.                                                                 Post partum procedures:none  Complications: None  Physical exam on 03/25/2018: Vitals:   03/24/18 1907 03/24/18 2309 03/25/18 0311 03/25/18 0800  BP: 121/79 (!) 96/49 117/72 110/65  Pulse: 99 81 81 76  Resp: 20 18  20   Temp: 99.5 F (37.5 C) 97.9 F (36.6 C)  98 F (36.7 C)  TempSrc: Oral Axillary  Oral  SpO2: 100% 100%  99%  Weight:      Height:       General: alert, cooperative and no distress Lochia: appropriate Uterine Fundus: firm Incision: Healing well with no significant drainage, Dressing is clean, dry, and intact DVT Evaluation: No evidence of DVT seen on physical exam.  Labs: Lab Results  Component Value Date   WBC 10.2 03/24/2018   HGB 8.4 (L) 03/24/2018   HCT 26.2 (L) 03/24/2018   MCV 83.2 03/24/2018    PLT 277 03/24/2018   CMP Latest Ref Rng & Units 08/21/2017  Glucose 70 - 99 mg/dL 89  BUN 6 - 20 mg/dL 7  Creatinine 5.28 - 4.13 mg/dL 2.44  Sodium 010 - 272 mmol/L 134(L)  Potassium 3.5 - 5.1 mmol/L 3.4(L)  Chloride 98 - 111 mmol/L 105  CO2 22 - 32 mmol/L 18(L)  Calcium 8.9 - 10.3 mg/dL 9.0  Total Protein 6.5 - 8.1 g/dL -  Total Bilirubin 0.3 - 1.2 mg/dL -  Alkaline Phos 38 - 536 U/L -  AST 15 - 41 U/L -  ALT 0 - 44 U/L -    Discharge instruction: per After Visit Summary.  Medications:  Allergies as of 03/25/2018   No Known Allergies     Medication List    STOP taking these medications   ondansetron 8 MG disintegrating tablet Commonly known as:  ZOFRAN-ODT     TAKE these medications   multivitamin-prenatal 27-0.8 MG Tabs tablet Take 1 tablet by mouth daily at 12 noon.   oxyCODONE-acetaminophen 5-325 MG tablet Commonly  known as:  PERCOCET/ROXICET Take 1 tablet by mouth every 6 (six) hours as needed for up to 5 days for moderate pain or severe pain.            Discharge Care Instructions  (From admission, onward)         Start     Ordered   03/25/18 0000  Discharge wound care:    Comments:  Keep incision dry, clean.   03/25/18 0925          Diet: routine diet  Activity: Advance as tolerated. Pelvic rest for 6 weeks.   Outpatient follow up: Follow-up Information    Nadara MustardHarris, Robert P, MD. Schedule an appointment as soon as possible for a visit in 2 week(s).   Specialty:  Obstetrics and Gynecology Contact information: [address] [place] [address] [phone]             Postpartum contraception: Depo Provera Rhogam Given postpartum: no Rubella vaccine given postpartum: no Varicella vaccine given postpartum: no TDaP given antepartum or postpartum: Yes  Newborn Data: Live born female Stacey BombardKendall Birth Weight: 6 lb 4.5 oz (2850 g) APGAR: 8, 9  Newborn Delivery   Birth date/time:  03/23/2018 15:11:00 Delivery type:  C-Section,  Low Transverse Trial of labor:  Yes C-section categorization:  Primary      Baby Feeding: Breast  Disposition:home with mother  SIGNED:  Tresea MallJane Kaytie Donovan, CNM 03/25/2018 9:26 AM

## 2018-03-23 NOTE — Anesthesia Preprocedure Evaluation (Signed)
Anesthesia Evaluation  Patient identified by MRN, date of birth, ID band Patient awake    Reviewed: Allergy & Precautions, NPO status , Patient's Chart, lab work & pertinent test results  Airway Mallampati: III  TM Distance: >3 FB     Dental   Pulmonary Current Smoker, former smoker,    Pulmonary exam normal        Cardiovascular negative cardio ROS Normal cardiovascular exam     Neuro/Psych negative neurological ROS  negative psych ROS   GI/Hepatic Neg liver ROS, Acute appendecitis   Endo/Other  negative endocrine ROS  Renal/GU negative Renal ROS  negative genitourinary   Musculoskeletal negative musculoskeletal ROS (+)   Abdominal Normal abdominal exam  (+)   Peds negative pediatric ROS (+)  Hematology negative hematology ROS (+)   Anesthesia Other Findings History reviewed. No pertinent past medical history.  Reproductive/Obstetrics (+) Pregnancy                             Anesthesia Physical  Anesthesia Plan  ASA: II and emergent  Anesthesia Plan: Epidural   Post-op Pain Management:    Induction:   PONV Risk Score and Plan: 3 and Ondansetron, Dexamethasone, Propofol and Midazolam  Airway Management Planned: Natural Airway  Additional Equipment:   Intra-op Plan:   Post-operative Plan:   Informed Consent: I have reviewed the patients History and Physical, chart, labs and discussed the procedure including the risks, benefits and alternatives for the proposed anesthesia with the patient or authorized representative who has indicated his/her understanding and acceptance.     Dental advisory given  Plan Discussed with: CRNA and Surgeon  Anesthesia Plan Comments:         Anesthesia Quick Evaluation

## 2018-03-23 NOTE — Transfer of Care (Signed)
Immediate Anesthesia Transfer of Care Note  Patient: Nasira A Keeler  Procedure(s) Performed: CESAREAN SECTION (N/A Abdomen)  Patient Location: PACU  Anesthesia Type:Epidural  Level of Consciousness: awake, alert  and oriented  Airway & Oxygen Therapy: Patient Spontanous Breathing  Post-op Assessment: Report given to RN and Post -op Vital signs reviewed and stable  Post vital signs: Reviewed and stable  Last Vitals:  Vitals Value Taken Time  BP    Temp    Pulse    Resp    SpO2      Last Pain:  Vitals:   03/23/18 1021  TempSrc: Axillary  PainSc:       Patients Stated Pain Goal: 3 (03/23/18 0502)  Complications: No apparent anesthesia complications

## 2018-03-23 NOTE — Discharge Instructions (Signed)

## 2018-03-23 NOTE — Op Note (Signed)
Cesarean Section Procedure Note Indications: cephalo-pelvic disproportion, failure to progress: arrest of dilation and term intrauterine pregnancy  Pre-operative Diagnosis: Intrauterine pregnancy [redacted]w[redacted]d ;  cephalo-pelvic disproportion, failure to progress: arrest of dilation and term intrauterine pregnancy Post-operative Diagnosis: same, delivered. Procedure: Low Transverse Cesarean Section Surgeon: Annamarie Major, MD, FACOG Assistant(s): Farrel Conners, CNM, No other capable assistant available, in surgery requiring high level assistant. Anesthesia: Epidural anesthesia Estimated Blood Loss:250 mL Complications: None; patient tolerated the procedure well. Disposition: PACU - hemodynamically stable. Condition: stable  Findings: A female infant in the cephalic OP presentation. Amniotic fluid - Clear  Birth weight 6-5 lbs.  Apgars of 8 and 9.  Intact placenta with a three-vessel cord. BICORNUATE UTERUS w FETUS IN LEFT HORN, well developed right horn as well.  Grossly normal  tubes and ovaries bilaterally. No intraabdominal adhesions were noted.  Procedure Details   The patient was taken to Operating Room, identified as the correct patient and the procedure verified as C-Section Delivery. A Time Out was held and the above information confirmed. After induction of anesthesia, the patient was draped and prepped in the usual sterile manner. A Pfannenstiel incision was made and carried down through the subcutaneous tissue to the fascia. Fascial incision was made and extended transversely with the Mayo scissors. The fascia was separated from the underlying rectus tissue superiorly and inferiorly. The peritoneum was identified and entered bluntly. Peritoneal incision was extended longitudinally. The utero-vesical peritoneal reflection was incised transversely and a bladder flap was created digitally.  A low transverse hysterotomy was made. The fetus was delivered atraumatically. The umbilical cord was  clamped x2 and cut and the infant was handed to the awaiting pediatricians. The placenta was removed intact and appeared normal with a 3-vessel cord.  The uterus was exteriorized and cleared of all clot and debris. The hysterotomy was closed with running sutures of 0 Vicryl suture. A second imbricating layer was placed with the same suture. Excellent hemostasis was observed. The uterus was returned to the abdomen. The pelvis was irrigated and again, excellent hemostasis was noted.  The On Q Pain pump System was then placed.  Trocars were placed through the abdominal wall into the subfascial space and these were used to thread the silver soaker cathaters into place.The rectus fascia was then reapproximated with running sutures of Maxon, with careful placement not to incorporate the cathaters. Subcutaneous tissues are then irrigated with saline and hemostasis assured.  Skin is then closed with 4-0 vicryl suture in a subcuticular fashion followed by skin adhesive. The cathaters are flushed each with 5 mL of Bupivicaine and stabilized into place with dressing. Instrument, sponge, and needle counts were correct prior to the abdominal closure and at the conclusion of the case.  The patient tolerated the procedure well and was transferred to the recovery room in stable condition.   Annamarie Major, MD, [facility] Frederick Ob/G[facility], [facility] 03/23/2018  4:14 PM

## 2018-03-23 NOTE — Anesthesia Procedure Notes (Signed)
Epidural Patient location during procedure: OB Start time: 03/23/2018 5:47 AM End time: 03/23/2018 5:56 AM  Staffing Anesthesiologist: Yves Dill, MD Performed: anesthesiologist   Preanesthetic Checklist Completed: patient identified, site marked, surgical consent, pre-op evaluation, timeout performed, IV checked, risks and benefits discussed and monitors and equipment checked  Epidural Patient position: sitting Prep: Betadine Patient monitoring: heart rate, continuous pulse ox and blood pressure Approach: midline Location: L3-L4 Injection technique: LOR air  Needle:  Needle type: Tuohy  Needle gauge: 17 G Needle length: 9 cm and 9 Catheter type: closed end flexible Catheter size: 19 Gauge Test dose: negative and 1.5% lidocaine with Epi 1:200 K  Assessment Events: blood not aspirated, injection not painful, no injection resistance, negative IV test and no paresthesia  Additional Notes Time out called.  Patient placed in sitting position.  The back was prepped and draped in sterile fashion.  A skin wheal was made with 1% Lidocaine plain in the L3-L4 interspace.  A 17G Tuohy needle was advanced into the epidural space by a loss of resistance technique.  No blood or paresthesias.  The epidural catheter was threaded 3 cm and the TD was negative.  The patient tolerated the procedure well.  The catheter was affixed to the back in sterile fashion.Reason for block:procedure for pain

## 2018-03-23 NOTE — Progress Notes (Signed)
  Labor Progress Note   23 y.o. G1P0 @ [redacted]w[redacted]d , admitted for  Pregnancy, Labor Management.   Subjective:  Will not dilate past 9.5 with lip of cervix all the way around, pushed as well to see if would safely reduce without success  Objective:  BP (!) 134/95 (BP Location: Left Arm)   Pulse (!) 108   Temp 97.6 F (36.4 C) (Axillary)   Resp 16   Ht 5\' 2"  (1.575 m)   Wt 73.9 kg   LMP 06/21/2017 (Approximate)   SpO2 100%   BMI 29.81 kg/m  Abd: gravid Extr: trace to 1+ bilateral pedal edema SVE: CERVIX: unchanged; 9.5/100/+2  EFM: FHR: 140 bpm, variability: moderate,  accelerations:  Present,  decelerations:  Absent Toco: Frequency: Every 2-4 minutes Labs: I have reviewed the patient's lab results.   Assessment & Plan:  G1P0 @ [redacted]w[redacted]d, admitted for  Pregnancy and Labor/Delivery Management  1. Pain management: epidural. 2. FWB: FHT category 1.  3. ID: GBS negative 4. Labor management: Protracted labor with arrest of dilation and inability to push past this leads [place] to discuss CS for FTP.  She prefers and requests this option.  The risks of cesarean section discussed with the patient included but were not limited to: bleeding which may require transfusion or reoperation; infection which may require antibiotics; injury to bowel, bladder, ureters or other surrounding organs; injury to the fetus; need for additional procedures including hysterectomy in the event of a life-threatening hemorrhage; placental abnormalities wth subsequent pregnancies, incisional problems, thromboembolic phenomenon and other postoperative/anesthesia complications. The patient concurred with the proposed plan, giving informed written consent for the procedure.   All discussed with patient, see orders  Annamarie Major, MD, Merlinda Frederick Ob/Gyn, [facility] 03/23/2018  2:27 PM

## 2018-03-23 NOTE — Anesthesia Post-op Follow-up Note (Signed)
Anesthesia QCDR form completed.        

## 2018-03-23 NOTE — Progress Notes (Signed)
  Labor Progress Note   23 y.o. G1P0 @ [redacted]w[redacted]d , admitted for  Pregnancy, Labor Management.   Subjective:  Progressing labor Epidural has helped w pain  Objective:  BP 114/64   Pulse (!) 101   Temp 98.3 F (36.8 C) (Axillary)   Resp 19   Ht 5\' 2"  (1.575 m)   Wt 73.9 kg   LMP 06/21/2017 (Approximate)   SpO2 100%   BMI 29.81 kg/m  Abd: gravid VTX Extr: trace to 1+ bilateral pedal edema SVE: CERVIX: 6 cm dilated, 80 effaced, -2 station, VTX  EFM: FHR: 140 bpm, variability: moderate,  accelerations:  Present,  decelerations:  Absent Toco: Frequency: Every 5 minutes Labs: I have reviewed the patient's lab results.   Assessment & Plan:  G1P0 @ [redacted]w[redacted]d, admitted for  Pregnancy and Labor/Delivery Management  1. Pain management: epidural. 2. FWB: FHT category 1.  3. ID: GBS negative 4. Labor management: AROM clear now Cont to monitor for progress Anticipate second stage of labor soon  All discussed with patient, see orders  Annamarie Major, MD, Merlinda Frederick Ob/Gyn, [facility] 03/23/2018  7:45 AM

## 2018-03-24 ENCOUNTER — Encounter: Payer: Medicaid Other | Admitting: Advanced Practice Midwife

## 2018-03-24 ENCOUNTER — Encounter: Payer: Self-pay | Admitting: Obstetrics & Gynecology

## 2018-03-24 LAB — CBC
HCT: 26.2 % — ABNORMAL LOW (ref 36.0–46.0)
Hemoglobin: 8.4 g/dL — ABNORMAL LOW (ref 12.0–15.0)
MCH: 26.7 pg (ref 26.0–34.0)
MCHC: 32.1 g/dL (ref 30.0–36.0)
MCV: 83.2 fL (ref 80.0–100.0)
NRBC: 0 % (ref 0.0–0.2)
Platelets: 277 10*3/uL (ref 150–400)
RBC: 3.15 MIL/uL — ABNORMAL LOW (ref 3.87–5.11)
RDW: 13.4 % (ref 11.5–15.5)
WBC: 10.2 10*3/uL (ref 4.0–10.5)

## 2018-03-24 LAB — RPR: RPR Ser Ql: NONREACTIVE

## 2018-03-24 MED ORDER — FERROUS SULFATE 325 (65 FE) MG PO TABS
325.0000 mg | ORAL_TABLET | Freq: Two times a day (BID) | ORAL | Status: DC
  Administered 2018-03-24 – 2018-03-25 (×2): 325 mg via ORAL
  Filled 2018-03-24 (×2): qty 1

## 2018-03-24 MED ORDER — MEDROXYPROGESTERONE ACETATE 150 MG/ML IM SUSP
150.0000 mg | Freq: Once | INTRAMUSCULAR | Status: AC
  Administered 2018-03-25: 150 mg via INTRAMUSCULAR
  Filled 2018-03-24: qty 1

## 2018-03-24 NOTE — Plan of Care (Signed)
Vs stable; tolerated clear liquids so diet advanced to regular; taking tylenol and toradol for pain control; foley catheter in place; will re-evaluate at 0600 due to urine color is amber BUT the amount is adequate; pt moving well in bed; pt lifts hips well off bed for peri care/foley care; breastfeeding and has needed a little assistance; wants to do "skin to skin as much as I can"; pt's mother at bedside and supportive BUT pt's mother does not know pt was +marijuana; don't discuss this (OR baby uds in front of anyone but patient)

## 2018-03-24 NOTE — Progress Notes (Signed)
POD #1 LTCS for FTP Subjective:   Doing well. Tolerating a regular diet. Foley came out this Am and she is voiding without difficulty. OOB without lightheadedness. Breast feeding. Bleeding is minimal. Pain well controlled.  Objective:  Blood pressure 108/73, pulse 72, temperature 98.1 F (36.7 C), temperature source Axillary, resp. rate 18, height 5\' 2"  (1.575 m), weight 73.9 kg, last menstrual period 06/21/2017, SpO2 100 %, unknown if currently breastfeeding.  General: WF in NAD Pulmonary: no increased work of breathing/ CTAB Heart: RRR without murmur Abdomen: soft,  non-tender, fundus firm, NABS x 4 Incision: Honeycomb dressing C&D&I, ON Q intact Extremities: no edema, no erythema, no tenderness  Results for orders placed or performed during the hospital encounter of 03/23/18 (from the past 72 hour(s))  Urine Drug Screen, Qualitative ([facility] only)     Status: Abnormal   Collection Time: 03/23/18  2:00 AM  Result Value Ref Range   Tricyclic, Ur Screen NONE DETECTED NONE DETECTED   Amphetamines, Ur Screen NONE DETECTED NONE DETECTED   MDMA (Ecstasy)Ur Screen NONE DETECTED NONE DETECTED   Cocaine Metabolite,Ur [place] NONE DETECTED NONE DETECTED   Opiate, Ur Screen NONE DETECTED NONE DETECTED   Phencyclidine (PCP) Ur S NONE DETECTED NONE DETECTED   Cannabinoid 50 Ng, Ur [place] POSITIVE (A) NONE DETECTED   Barbiturates, Ur Screen NONE DETECTED NONE DETECTED   Benzodiazepine, Ur Scrn NONE DETECTED NONE DETECTED   Methadone Scn, Ur NONE DETECTED NONE DETECTED    Comment: (NOTE) Tricyclics + metabolites, urine    Cutoff 1000 ng/mL Amphetamines + metabolites, urine  Cutoff 1000 ng/mL MDMA (Ecstasy), urine              Cutoff 500 ng/mL Cocaine Metabolite, urine          Cutoff 300 ng/mL Opiate + metabolites, urine        Cutoff 300 ng/mL Phencyclidine (PCP), urine         Cutoff 25 ng/mL Cannabinoid, urine                 Cutoff 50 ng/mL Barbiturates + metabolites, urine  Cutoff 200  ng/mL Benzodiazepine, urine              Cutoff 200 ng/mL Methadone, urine                   Cutoff 300 ng/mL The urine drug screen provides only a preliminary, unconfirmed analytical test result and should not be used for non-medical purposes. Clinical consideration and professional judgment should be applied to any positive drug screen result due to possible interfering substances. A more specific alternate chemical method must be used in order to obtain a confirmed analytical result. Gas chromatography / mass spectrometry (GC/MS) is the preferred confirmat ory method. Performed at [facility], [address] [place], [place] [address]   Chlamydia/NGC rt PCR [facility] only)     Status: None   Collection Time: 03/23/18  2:00 AM  Result Value Ref Range   Specimen source GC/Chlam URINE, RANDOM    Chlamydia Tr NOT DETECTED NOT DETECTED   N gonorrhoeae NOT DETECTED NOT DETECTED    Comment: (NOTE) This CT/NG assay has not been evaluated in patients with a history of  hysterectomy. Performed at [facility], [address] [place], [place] [address]   CBC     Status: Abnormal   Collection Time: 03/23/18  2:49 AM  Result Value Ref Range   WBC 8.0 4.0 - 10.5 K/uL  RBC 3.77 (L) 3.87 - 5.11 MIL/uL   Hemoglobin 10.1 (L) 12.0 - 15.0 g/dL   HCT 53.7 (L) 48.2 - 70.7 %   MCV 80.4 80.0 - 100.0 fL   MCH 26.8 26.0 - 34.0 pg   MCHC 33.3 30.0 - 36.0 g/dL   RDW 86.7 54.4 - 92.0 %   Platelets 315 150 - 400 K/uL   nRBC 0.0 0.0 - 0.2 %    Comment: Performed at [facility], [address] [place], [place] [address]  Type and screen [facility]     Status: None   Collection Time: 03/23/18  2:49 AM  Result Value Ref Range   ABO/RH(D) A POS    Antibody Screen NEG    Sample Expiration      03/26/2018 Performed at [facility], [address] [place], [place] [address]   RPR     Status: None   Collection Time: 03/23/18  2:49 AM   Result Value Ref Range   RPR Ser Ql Non Reactive Non Reactive    Comment: (NOTE) Performed At: [facility] [address] [place], [place] [id number] Jolene Schimke MD ME:[id number]   CBC     Status: Abnormal   Collection Time: 03/24/18  5:08 AM  Result Value Ref Range   WBC 10.2 4.0 - 10.5 K/uL   RBC 3.15 (L) 3.87 - 5.11 MIL/uL   Hemoglobin 8.4 (L) 12.0 - 15.0 g/dL   HCT 80.8 (L) 81.1 - 03.1 %   MCV 83.2 80.0 - 100.0 fL   MCH 26.7 26.0 - 34.0 pg   MCHC 32.1 30.0 - 36.0 g/dL   RDW 59.4 58.5 - 92.9 %   Platelets 277 150 - 400 K/uL   nRBC 0.0 0.0 - 0.2 %    Comment: Performed at [facility], [address], [place], [place] [address]     Assessment:   23 y.o. G1P1001 postoperativeday # 1-stable  Continue postpartum/ postoperative care  Ambulate with assistance   Plan:  1) Acute blood loss anemia - hemodynamically stable and asymptomatic - po ferrous sulfate/ vitamins  2) A POS/ RI/ VI  3) TDAP 01/26/18   4) Breast/Contraception: Depo Provera  5) Disposition-probable discharge tomorrow   Farrel Conners, CNM

## 2018-03-24 NOTE — Clinical Social Work Maternal (Addendum)
CLINICAL SOCIAL WORK MATERNAL/CHILD NOTE  Patient Details  Name: Stacey Donovan MRN: [id number] Date of Birth: 1995-11-21  Date:  03/24/2018  Clinical Social Worker Initiating Note:  Mel Almond Rovena Hearld, [place] [phone] ) Date/Time: Initiated:  03/24/18/1808     Child's Name:  [name])   Biological Parents:  Mother   Need for Interpreter:  None   Reason for Referral:  Current Substance Use/Substance Use During Pregnancy    Address:  [place] [address]    Phone number:  [phone] (home)     Additional phone number:   Household Members/Support Persons (HM/SP):   Household Member/Support Person 1   HM/SP Name Relationship DOB or Age  HM/SP -1 Kenney Houseman ) (Mother )    HM/SP -2        HM/SP -3        HM/SP -4        HM/SP -5        HM/SP -6        HM/SP -7        HM/SP -8          Natural Supports (not living in the home):  Parent, Spouse/significant other, Friends, Extended Family, Immediate Family   Professional Supports:     Employment: Agricultural engineer   Type of Work:     Education:  Programmer, systems   Homebound arranged:    Museum/gallery curator Resources:  Medicaid   Other Resources:  Child Support, [place]   Cultural/Religious Considerations Which May Impact Care:    Strengths:  Home prepared for child    Psychotropic Medications:         Pediatrician:       Pediatrician List:   [place]      Pediatrician Fax Number:    Risk Factors/Current Problems:  Substance Use    Cognitive State:  Alert , Goal Oriented    Mood/Affect:  Calm , Relaxed    CSW Assessment: Clinical Education officer, museum (CSW) received consult for drug exposed newborn. Per RN mother has a history of using marijuana during pregnancy. Per RN mother's urine drug screen was positive for marijuana. Infant's urine and cord tissue drug screens are pending. CSW met with  mother at bedside and her mother Kenney Houseman was at bedside. Tonya stepped out of the room during assessment. Mother was alert and oriented X4 and was sitting up in the bed. CSW introduced self and explained role of CSW department. Per mother she lives with her mother Kenney Houseman and Royal Kun[place] boyfriend. Per mother the father of the baby Erlene Quan is very involved and they have been in a relationship since Middle School. Per mother this is her first baby. Per mother she last used marijuana 1.5 months ago and told her midwife about it. Per mother she uses marijuana as an appetite simulate. Mother reported that she does not drink alcohol or use any other drugs. Per mother she has all the supplies needed for infant including a car seat. CSW provided mother with [facility] list including outpatient substance abuse treatment options. CSW made mother aware that if urine drug screen or cord tissue drug screen is positive then a Child Protective Services (CPS) report will be made. Mother verbalized her understanding and reported no other needs or concerns.   CSW Plan/Description:  CSW Will Continue to Monitor Umbilical Cord Tissue  Drug Screen Results and Make Report if [facility], Lenice Llamas 03/24/2018, 6:10 PM

## 2018-03-24 NOTE — Lactation Note (Signed)
This note was copied from a baby's chart. Lactation Consultation Note  Patient Name: Girl Elnora MorrisonBriana Herrman ZOXWR'UToday's Date: 03/24/2018 Reason for consult: Initial assessment   Maternal Data    Feeding Feeding Type: Breast Fed Nipple Type: Slow - flow  LATCH Score Latch: Repeated attempts needed to sustain latch, nipple held in mouth throughout feeding, stimulation needed to elicit sucking reflex.  Audible Swallowing: Spontaneous and intermittent  Type of Nipple: Flat  Comfort (Breast/Nipple): Soft / non-tender  Hold (Positioning): Assistance needed to correctly position infant at breast and maintain latch.  LATCH Score: 7  Interventions Interventions: Breast feeding basics reviewed;Adjust position;Support pillows  Lactation Tools Discussed/Used     Consult Status Consult Status: Follow-up Date: 03/24/18 Follow-up type: In-patient Mother states that it was difficult to latch baby Penni BombardKendall because her nipples are flat but she managed to latch infant on her own. LC adjusted Kendall's position and flanged her lips. Mother denies any discomfort or pain. LC educated on correct positioning, signs of a good latch, frequency of wet and dirty diapers and hunger cues.    Arlyss Gandylicia Temiloluwa [place] 03/24/2018, 10:10 AM

## 2018-03-24 NOTE — Anesthesia Postprocedure Evaluation (Signed)
Anesthesia Post Note  Patient: Stacey Donovan  Procedure(s) Performed: CESAREAN SECTION (N/A Abdomen)  Patient location during evaluation: Mother Baby Anesthesia Type: Epidural Level of consciousness: awake and alert Pain management: pain level controlled Vital Signs Assessment: post-procedure vital signs reviewed and stable Respiratory status: spontaneous breathing, nonlabored ventilation and respiratory function stable Cardiovascular status: stable Postop Assessment: no headache, no backache and epidural receding Anesthetic complications: no     Last Vitals:  Vitals:   03/24/18 0400 03/24/18 0500  BP:    Pulse: 71 75  Resp:    Temp:    SpO2: 95% 95%    Last Pain:  Vitals:   03/24/18 0318  TempSrc: Oral  PainSc:                  Jules SchickLogan,  Iosefa Weintraub P

## 2018-03-24 NOTE — Anesthesia Post-op Follow-up Note (Signed)
  Anesthesia Pain Follow-up Note  Patient: Stacey Donovan  Day #: 1  Date of Follow-up: 03/24/2018 Time: 7:29 AM  Last Vitals:  Vitals:   03/24/18 0400 03/24/18 0500  BP:    Pulse: 71 75  Resp:    Temp:    SpO2: 95% 95%    Level of Consciousness: alert  Pain: mild   Side Effects:None  Catheter Site Exam:clean     Plan: D/C from anesthesia care at surgeon's request  Jules Schick

## 2018-03-25 DIAGNOSIS — Z98891 History of uterine scar from previous surgery: Secondary | ICD-10-CM

## 2018-03-25 HISTORY — DX: History of uterine scar from previous surgery: Z98.891

## 2018-03-25 MED ORDER — MEDROXYPROGESTERONE ACETATE 150 MG/ML IM SUSP: 150.0000 mg | Freq: Once | INTRAMUSCULAR | Status: DC

## 2018-03-25 MED ORDER — OXYCODONE-ACETAMINOPHEN 5-325 MG PO TABS: 1.0000 | ORAL_TABLET | Freq: Four times a day (QID) | ORAL | 0 refills | Status: AC | PRN

## 2018-03-25 NOTE — Progress Notes (Signed)
Dc to home with NB.  To car via auxillary staff in [facility]

## 2018-03-25 NOTE — Clinical Social Work Note (Signed)
Patient's newborn's urine drug screen was positive for marijuana. DSS CPS report made. York Spaniel MSW,LCSW [phone]

## 2018-03-25 NOTE — Progress Notes (Signed)
DC inst reviewed with pt.  Rx given for home use.  Ready for dc, but wants to have pictures made from [employer] made first.

## 2018-04-05 ENCOUNTER — Telehealth: Payer: Self-pay

## 2018-04-05 NOTE — Telephone Encounter (Signed)
yes

## 2018-04-05 NOTE — Telephone Encounter (Signed)
Pt reports Stabbing pain on both sides of the incision, the nurse that sis her at home visit reports the incision looked fine. Pt  ran out of pain meds, only been taking Tylenol, nurse advised her she could add ibuprofen ,  edinburgh score was 14. Should she be seen for the elevated edinburgh score?

## 2018-04-06 NOTE — Telephone Encounter (Signed)
Can you call pt and schedule appointment for depression and anxiety postpartum visit.

## 2018-04-06 NOTE — Telephone Encounter (Signed)
Patient is schedule 04/09/18 with [facility]

## 2018-04-09 ENCOUNTER — Ambulatory Visit (INDEPENDENT_AMBULATORY_CARE_PROVIDER_SITE_OTHER): Payer: Medicaid Other | Admitting: Obstetrics & Gynecology

## 2018-04-09 ENCOUNTER — Encounter: Payer: Self-pay | Admitting: Obstetrics & Gynecology

## 2018-04-09 VITALS — BP 90/60 | Ht 62.0 in | Wt 136.0 lb

## 2018-04-09 DIAGNOSIS — Z98891 History of uterine scar from previous surgery: Secondary | ICD-10-CM

## 2018-04-09 NOTE — Progress Notes (Signed)
  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  Subjective: Patient reports marked improvement in her preop symptoms. Eating a regular diet without difficulty. The patient is not having any pain.  Activity: normal activities of daily living. Patient reports additional symptom's since surgery of min lochia.  She has some anxiety and feelings of being overwhelmed, [place] 13, no SI or HI or neglect.  Objective: BP 90/60   Ht 5\' 2"  (1.575 m)   Wt 136 lb (61.7 kg)   LMP 06/21/2017 (Approximate)   BMI 24.87 kg/m  Physical Exam Constitutional:      General: She is not in acute distress.    Appearance: She is well-developed.  Cardiovascular:     Rate and Rhythm: Normal rate.  Pulmonary:     Effort: Pulmonary effort is normal.  Abdominal:     General: There is no distension.     Palpations: Abdomen is soft.     Tenderness: There is no abdominal tenderness.     Comments: Incision Healing Well   Musculoskeletal: Normal range of motion.  Neurological:     Mental Status: She is alert and oriented to person, place, and time.     Cranial Nerves: No cranial nerve deficit.  Skin:    General: Skin is warm and dry.   Assessment: s/p :  cesarean section progressing well  Plan: Patient has done well after surgery with no apparent complications.  I have discussed the post-operative course to date, and the expected progress moving forward.  The patient understands what complications to be concerned about.  I will see the patient in routine follow up, or sooner if needed.    Counseling offered, meds discussed, will cont to monitor mood sx's and signs of PPD.  Activity plan: No heavy lifting.Marland Kitchen  Pelvic rest.  Letitia Libra 04/09/2018, 4:03 PM

## 2018-05-07 ENCOUNTER — Ambulatory Visit: Payer: Medicaid Other | Admitting: Obstetrics & Gynecology

## 2018-12-20 IMAGING — CT CT ABD-PELV W/ CM
2 of 4 series · 16 of 46 positions shown, 18 images · IV contrast (APPLIED)
Comparison: Abdominal x-ray dated July 12, 2015.

CLINICAL DATA: Abdominal pain and vomiting.

EXAM:
CT ABDOMEN AND PELVIS WITH CONTRAST
TECHNIQUE: Multidetector CT imaging of the abdomen and pelvis was performed
using the standard protocol following bolus administration of
intravenous contrast.
CONTRAST:  100mL O4OFLF-LVV IOPAMIDOL (O4OFLF-LVV) INJECTION 61%

[Series 2: routine abd/pel with · axial · 0.66mm/px · z∈[-853,-498]mm · 13 of 79 slices shown, 15 images]
[im 4/79  soft-tissue]
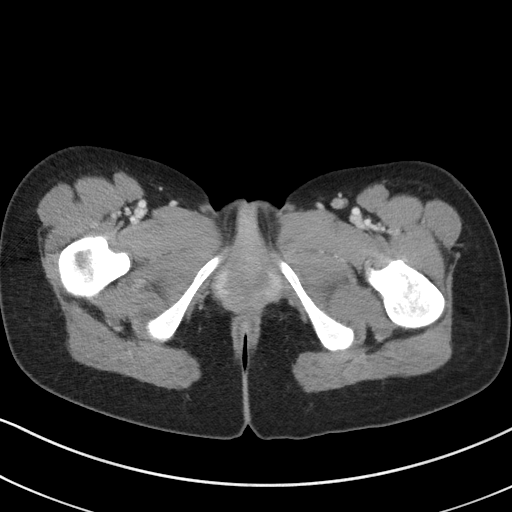
[im 4/79  bone]
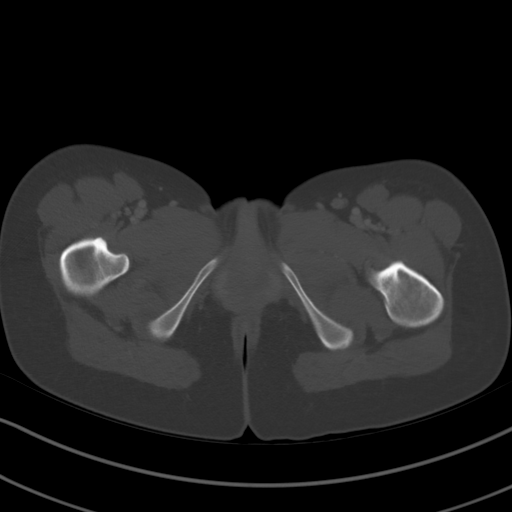
[im 10/79  soft-tissue]
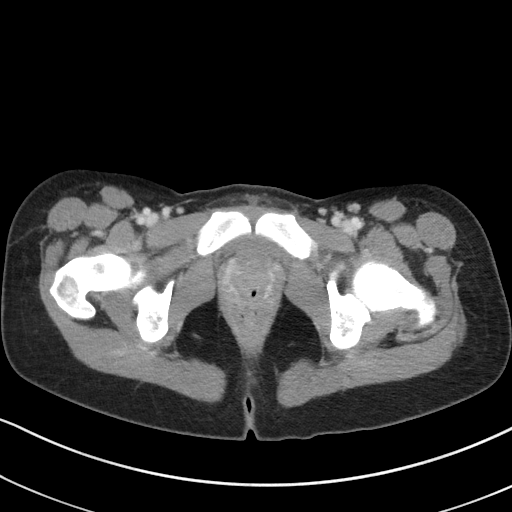
[im 16/79  soft-tissue]
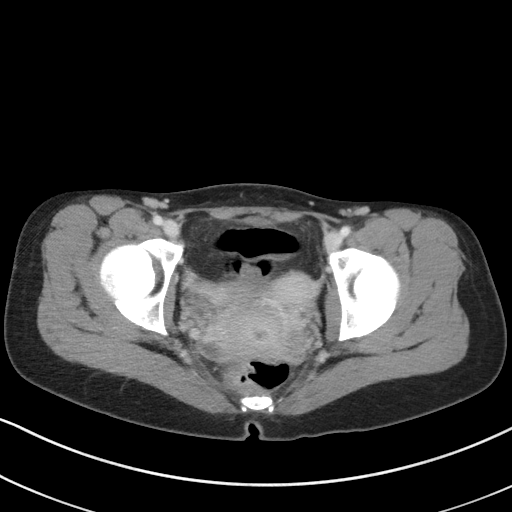
[im 22/79  soft-tissue]
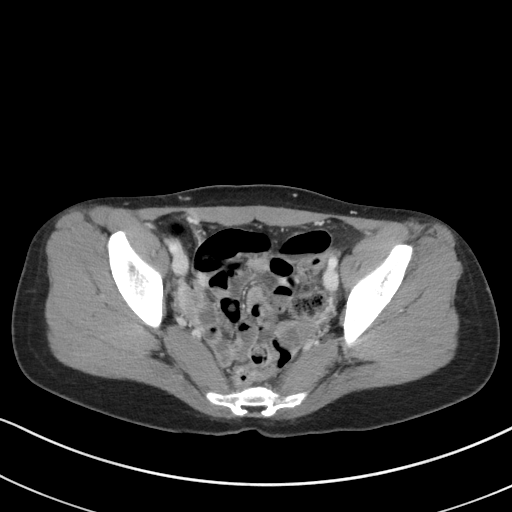
[im 29/79  soft-tissue]
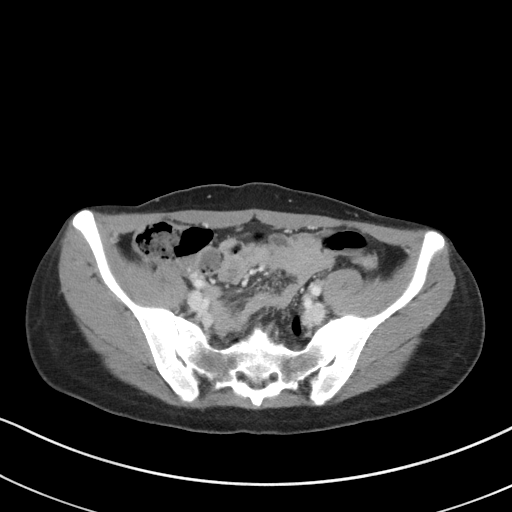
[im 35/79  soft-tissue]
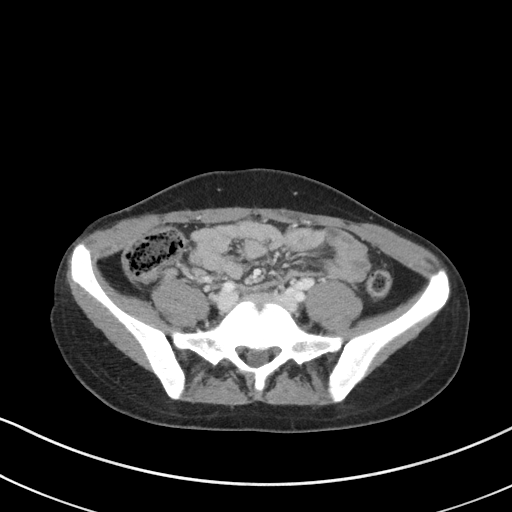
[im 41/79  soft-tissue]
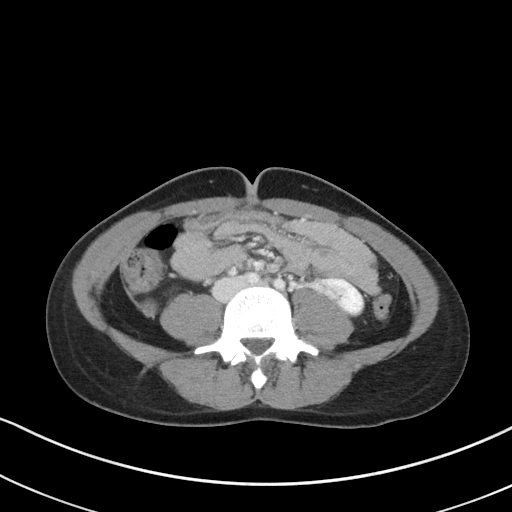
[im 44/79  soft-tissue]
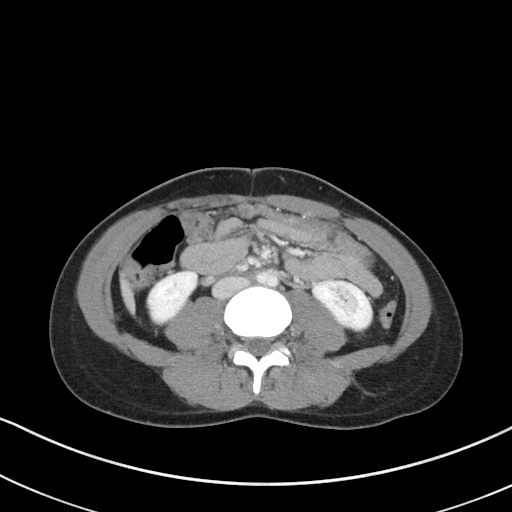
[im 50/79  soft-tissue]
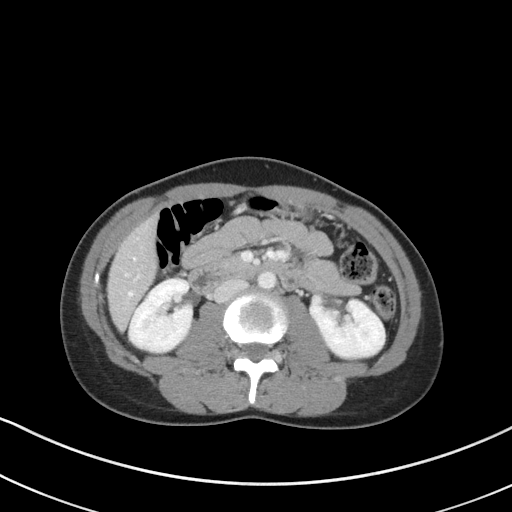
[im 50/79  bone]
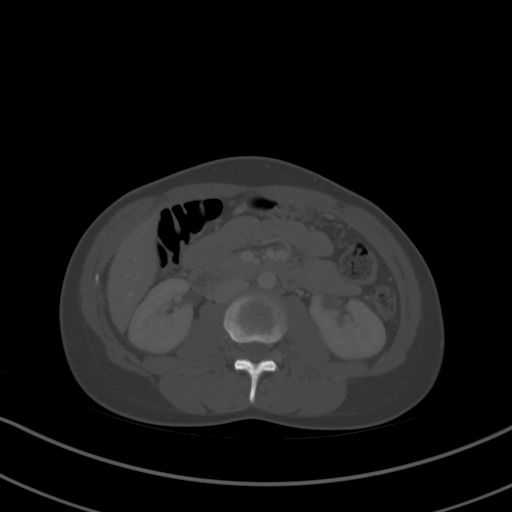
[im 57/79  soft-tissue]
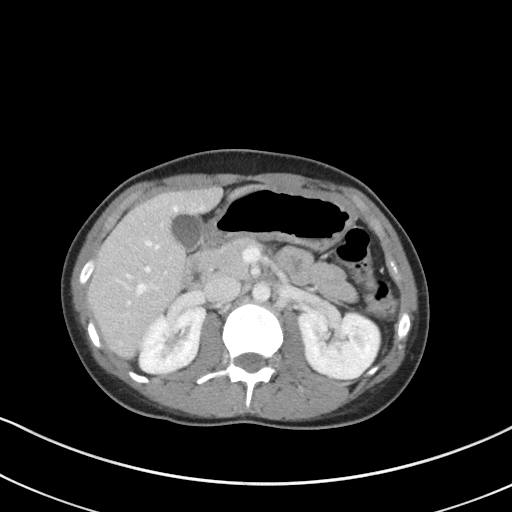
[im 63/79  soft-tissue]
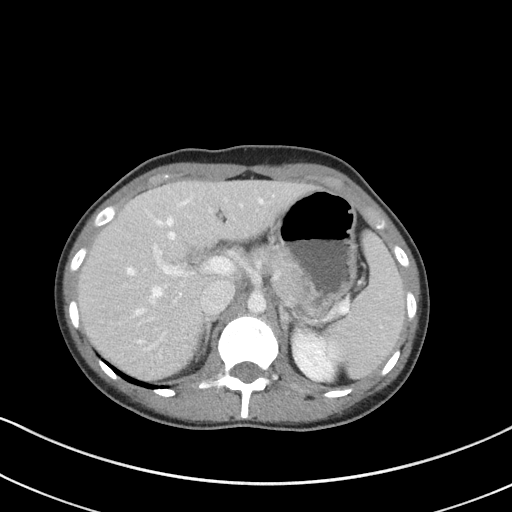
[im 69/79  soft-tissue]
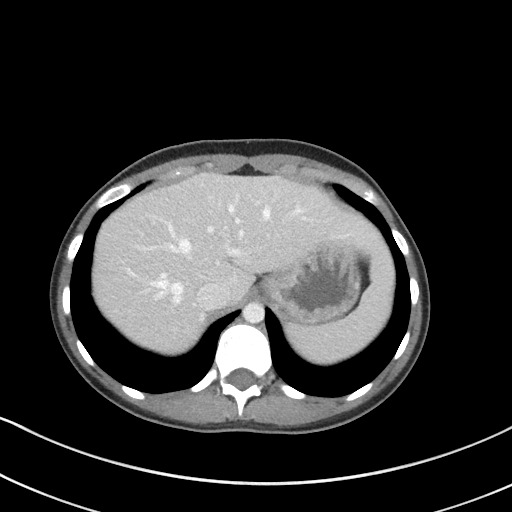
[im 75/79  soft-tissue]
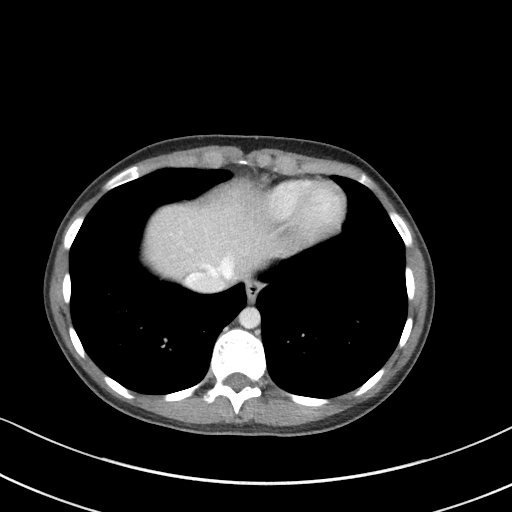

[Series 5: coronal st · coronal · 0.68mm/px · 3 of 91 slices shown]
[im 31/91  soft-tissue]
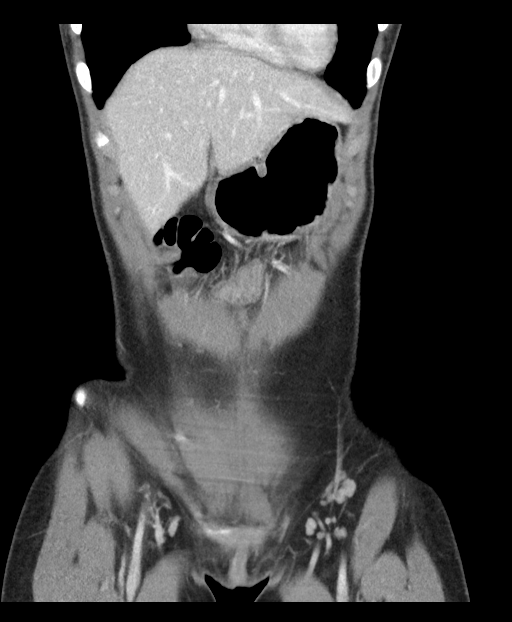
[im 41/91  soft-tissue]
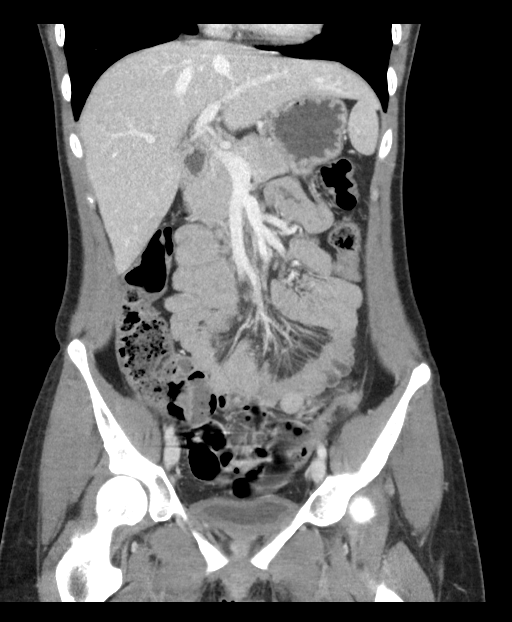
[im 51/91  soft-tissue]
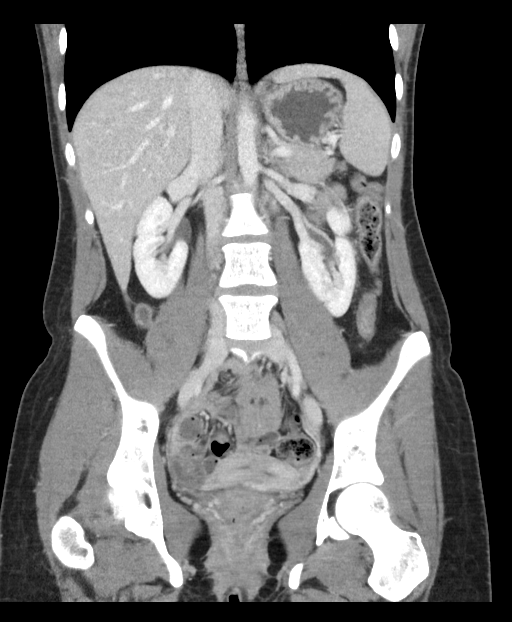

[16 of 46 positions shown; findings below may reference images not displayed]

FINDINGS: Lower chest: The lung bases are clear. The heart is normal in size.
No pericardial effusion.

Hepatobiliary: No focal liver abnormality is seen. No gallstones,
gallbladder wall thickening, or biliary dilatation.

Pancreas: Unremarkable. No pancreatic ductal dilatation or
surrounding inflammatory changes.

Spleen: Normal in size without focal abnormality.

Adrenals/Urinary Tract: Adrenal glands are unremarkable. Kidneys are
normal, without renal calculi, focal lesion, or hydronephrosis.
Bladder is under distended.

Stomach/Bowel: Stomach is within normal limits. Appendix is dilated,
measuring up to 11 mm, and contains an appendicolith. There is mild
surrounding periappendiceal inflammation. No evidence of
obstruction.

Vascular/Lymphatic: No significant vascular findings are present. No
enlarged abdominal or pelvic lymph nodes.

Reproductive: Uterus and bilateral adnexa are unremarkable.

Other: Trace free fluid in the pelvis.

Musculoskeletal: No acute or significant osseous findings.
IMPRESSION: 1. Acute uncomplicated appendicitis. No evidence of perforation or
abscess.
These results will be called to the ordering clinician or
representative by the Radiologist Assistant, and communication
documented in the PACS or zVision Dashboard.

## 2019-12-29 IMAGING — US US OB COMP LESS 14 WK
1 series · 13 of 28 positions shown · non-contrast
Comparison: None.

CLINICAL DATA: 21-year-old pregnant female with vaginal bleeding.
LMP: 06/21/2017 corresponding to an estimated gestational age of 6
weeks, 6 days.

EXAM:
OBSTETRIC <14 WK US AND TRANSVAGINAL OB US
TECHNIQUE: Both transabdominal and transvaginal ultrasound examinations were
performed for complete evaluation of the gestation as well as the
maternal uterus, adnexal regions, and pelvic cul-de-sac.
Transvaginal technique was performed to assess early pregnancy.

[Series 1: us ob comp less 14 wk · 0.13mm/px · 124 acquisitions, 13 frames shown]
[im 5/124]
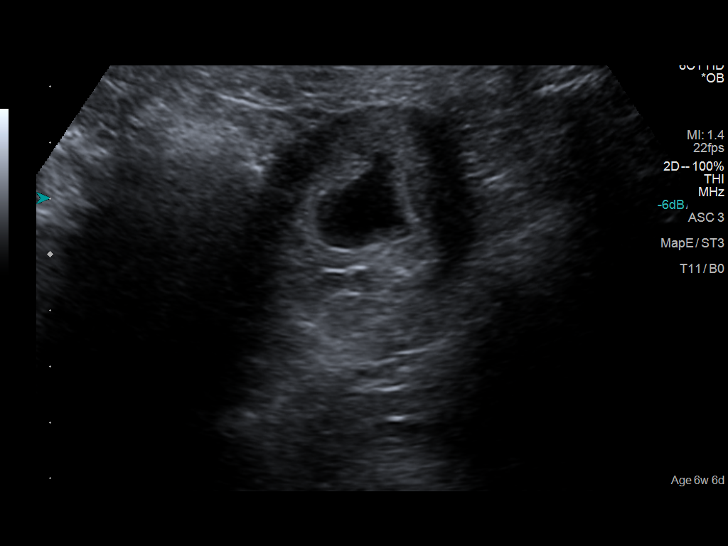
[im 14/124]
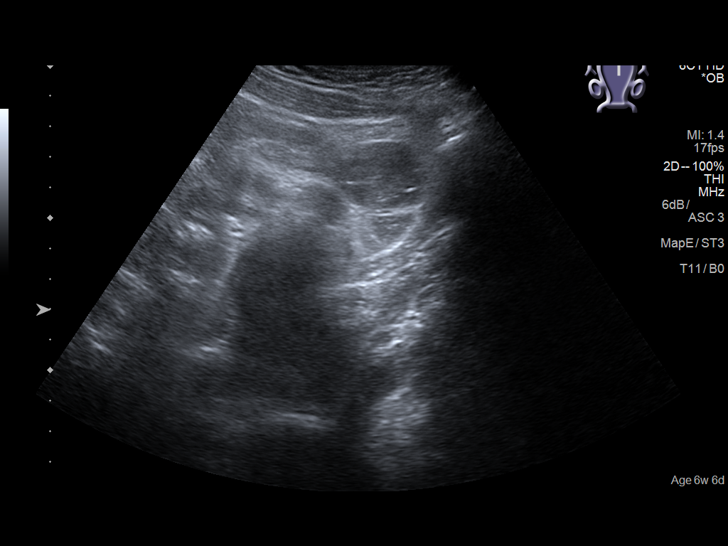
[im 23/124]
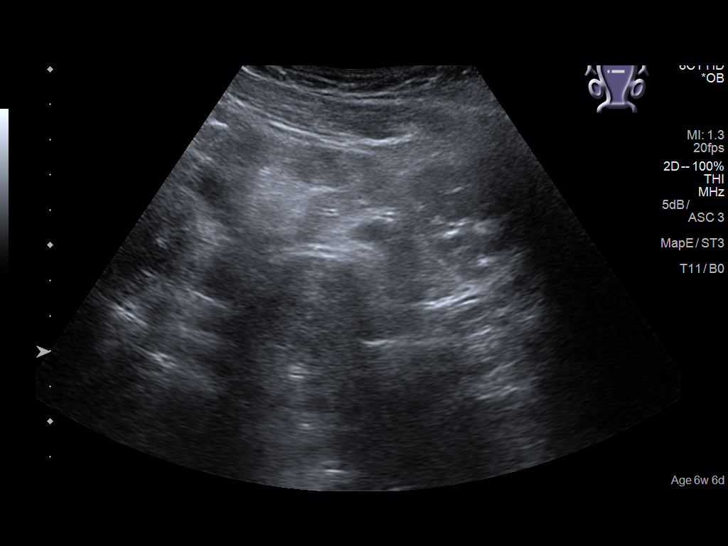
[im 32/124]
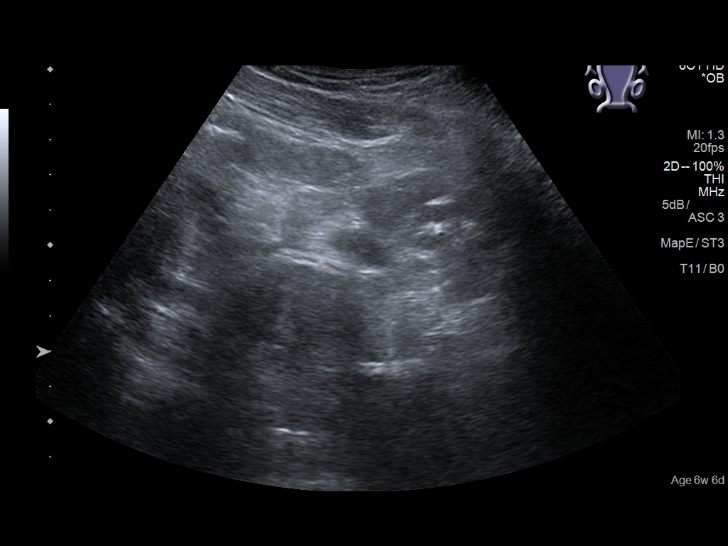
[im 42/124]
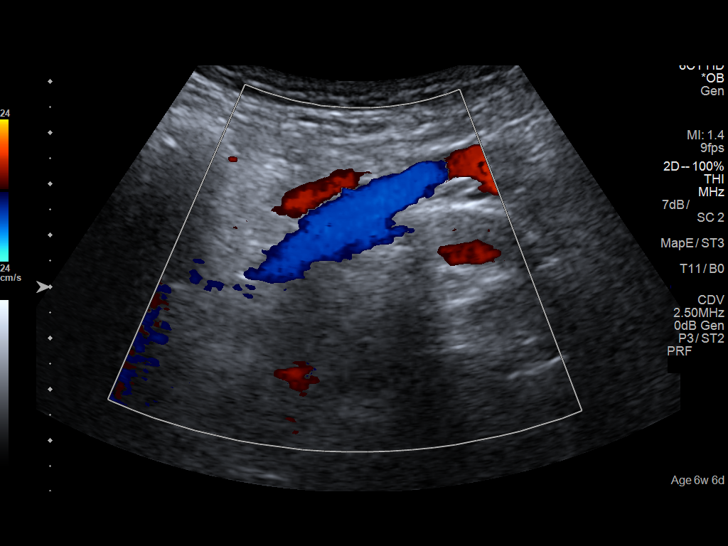
[im 51/124]
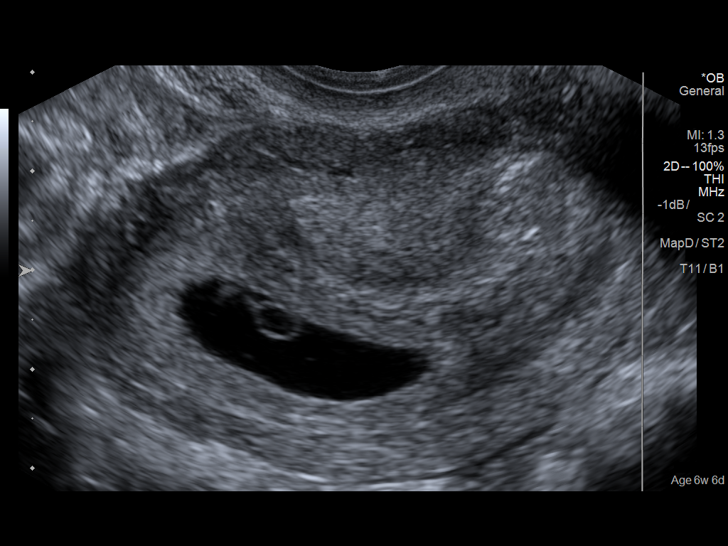
[im 64/124]
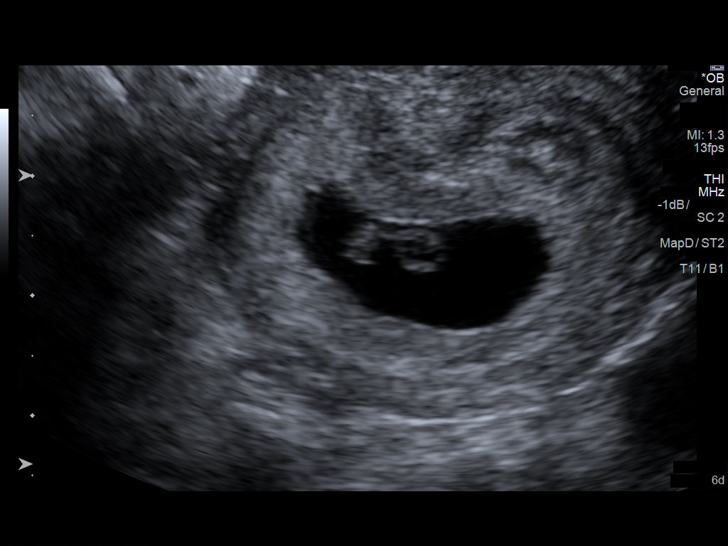
[im 73/124]
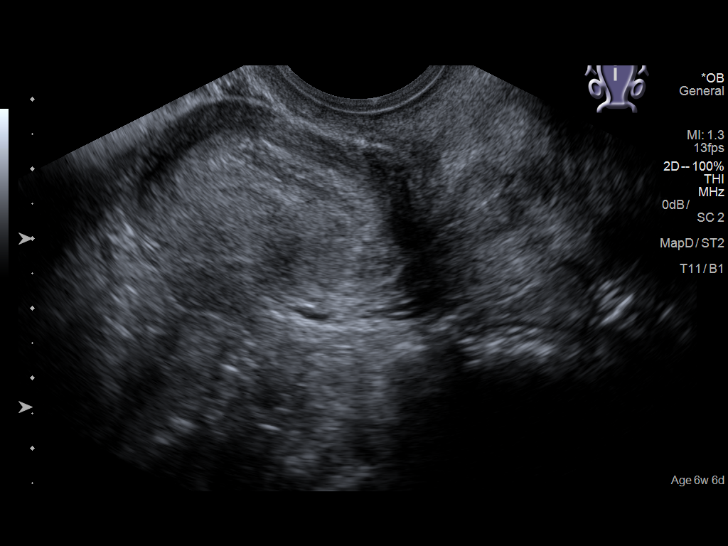
[im 83/124]
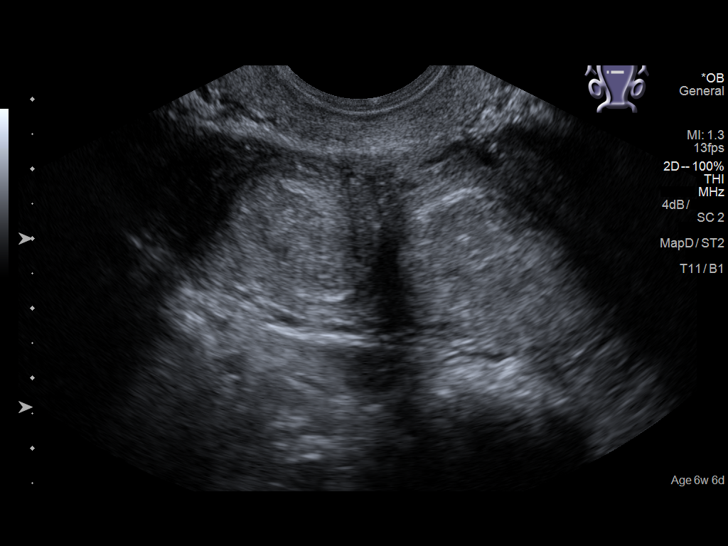
[im 92/124]
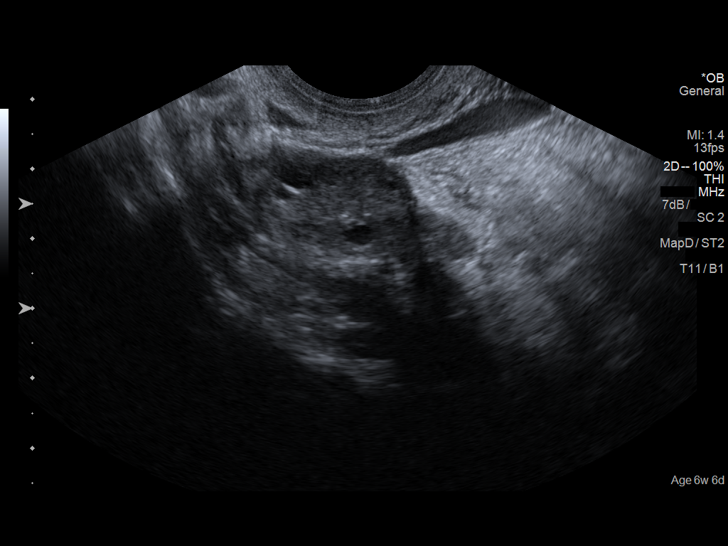
[im 101/124]
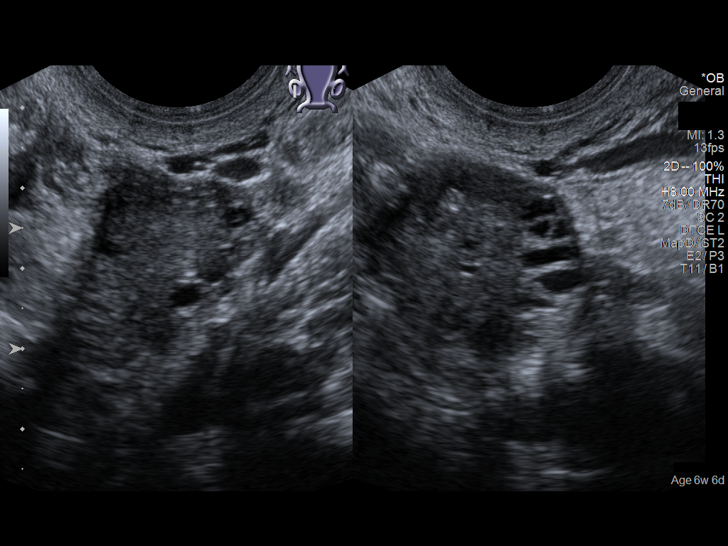
[im 110/124]
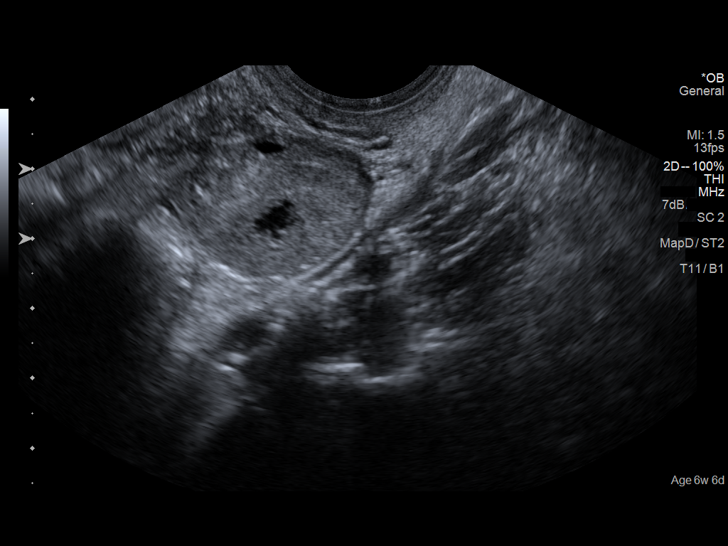
[im 119/124]
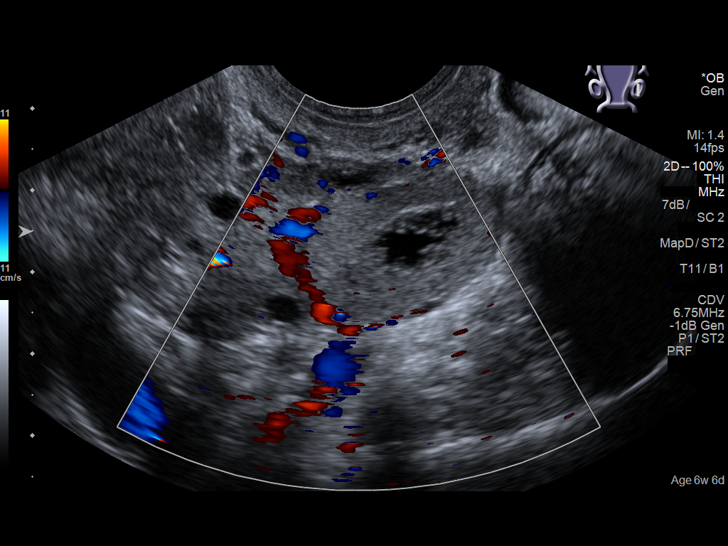

[13 of 28 positions shown; findings below may reference images not displayed]

FINDINGS: Intrauterine gestational sac: Single intrauterine gestational sac.

Yolk sac:  Seen

Embryo:  Present

Cardiac Activity: Detected

Heart Rate: 132 bpm

CRL:  39 mm   6 w   0 d                  US EDC: 04/03/2018

Subchorionic hemorrhage:  None visualized.

Maternal uterus/adnexae: There is bicornuate or didelphys uterus as
seen on the prior CT. The gestational sac appears to be in the left
horn of the uterus. Maternal ovaries are unremarkable. There is a
complex/hemorrhagic corpus luteum in the left ovary.
IMPRESSION: 1. Single live intrauterine pregnancy with an estimated gestational
age of 6 weeks, 0 days based on today's ultrasound.
2. Didelphys or bicornuate morphology of the uterus. The gestational
sac appears within the left horn of the uterus.

## 2023-01-27 ENCOUNTER — Ambulatory Visit (INDEPENDENT_AMBULATORY_CARE_PROVIDER_SITE_OTHER): Payer: Medicaid Other | Admitting: Obstetrics

## 2023-01-27 ENCOUNTER — Encounter: Payer: Self-pay | Admitting: Obstetrics

## 2023-01-27 VITALS — BP 100/60 | HR 96 | Ht 62.0 in | Wt 117.0 lb

## 2023-01-27 DIAGNOSIS — N926 Irregular menstruation, unspecified: Secondary | ICD-10-CM

## 2023-01-27 DIAGNOSIS — Z3201 Encounter for pregnancy test, result positive: Secondary | ICD-10-CM | POA: Diagnosis not present

## 2023-01-27 DIAGNOSIS — N912 Amenorrhea, unspecified: Secondary | ICD-10-CM

## 2023-01-27 LAB — POCT URINE PREGNANCY: Preg Test, Ur: POSITIVE — AB

## 2023-01-27 NOTE — Progress Notes (Signed)
    NURSE VISIT NOTE  Subjective:    Patient ID: Stacey Donovan, female    DOB: October 15, 1995, 27 y.o.   MRN: [id number]  HPI  Patient is a 27 y.o. G92P1001 female who presents for evaluation of amenorrhea. She believes she could be pregnant. Pregnancy is desired. Sexual Activity: single partner, contraception: none. Current symptoms also include: breast tenderness, fatigue, frequent urination, morning sickness, nausea, and positive home pregnancy test. Last period was 12/16/22, sure of exact date.    Objective:    BP 100/60   Pulse 96   Ht 5\' 2"  (1.575 m)   Wt 117 lb (53.1 kg)   LMP 12/16/2022   BMI 21.40 kg/m   Lab Review  Results for orders placed or performed in visit on 01/27/23  POCT urine pregnancy  Result Value Ref Range   Preg Test, Ur Positive (A) Negative    Assessment:   1. Missed period   2. Encounter for pregnancy test, result positive     Plan:   Pregnancy Test: Positive  Estimated Date of Delivery: 09/22/23 based on exact LMP of 12/16/22 BP Cuff Measurement taken. Cuff Size Adult Small Encouraged well-balanced diet, plenty of rest when needed, pre-natal vitamins daily and walking for exercise.  Discussed self-help for nausea, avoiding OTC medications until consulting provider or pharmacist, other than Tylenol as needed, minimal caffeine (1-2 cups daily) and avoiding alcohol.   She will schedule her nurse visit @ 7-[redacted] wks pregnant, u/s for dating @10  wk, and NOB visit at [redacted] wk pregnant.    Feel free to call with any questions.   Julieanne Manson, DO [facility]

## 2023-02-09 ENCOUNTER — Telehealth: Payer: Medicaid Other

## 2023-02-16 ENCOUNTER — Telehealth: Payer: Medicaid Other

## 2023-02-16 DIAGNOSIS — Z3689 Encounter for other specified antenatal screening: Secondary | ICD-10-CM

## 2023-02-16 DIAGNOSIS — Z3480 Encounter for supervision of other normal pregnancy, unspecified trimester: Secondary | ICD-10-CM | POA: Insufficient documentation

## 2023-02-16 NOTE — Progress Notes (Addendum)
 New OB Intake  I connected with  Stacey Donovan on 02/16/23 at  1:15 PM EST by telephone Visit and verified that I am speaking with the correct person using two identifiers. Nurse is located at [facility] and pt is located at hotel [facility]) .  I discussed the limitations, risks, security and privacy concerns of performing an evaluation and management service by telephone and the availability of in person appointments. I also discussed with the patient that there may be a patient responsible charge related to this service. The patient expressed understanding and agreed to proceed.  I explained I am completing New OB Intake today. We discussed her EDD of 09/22/2023 that is based on LMP of 12/16/2022. Pt is G2/P1. I reviewed her allergies, medications, Medical/Surgical/OB history, and appropriate screenings. There are no cats in the home in the home  .Based on history, this is a/an pregnancy uncomplicated . Her obstetrical history is significant for  none .  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   Labs Discussed [place] genetic screening with patient. Patient will get genetic testing to be drawn at new OB visit. Discussed possible labs to be drawn at new OB appointment.  COVID Vaccine Patient has not had COVID vaccine.   Social Determinants of Health Food Insecurity: denies food insecurity WIC Referral: Patient is interested in referral to [facility].  Transportation: Patient denies transportation needs. Childcare: Discussed no children allowed at ultrasound  appointments.   First visit review I reviewed new OB appt with pt. I explained she will have ob bloodwork and pap smear/pelvic exam if indicated. Explained pt will be seen by Harlene Cisco at first visit 03/12/2023; encounter routed to appropriate provider.   Stacey Donovan, CMA 02/16/2023  1:47 PM

## 2023-02-16 NOTE — Patient Instructions (Signed)
 First Trimester of Pregnancy  The first trimester of pregnancy starts on the first day of your last menstrual period until the end of week 12. This is also called months 1 through 3 of pregnancy. Body changes during your first trimester Your body goes through many changes during pregnancy. The changes usually return to normal after your baby is born. Physical changes You may gain or lose weight. Your breasts may grow larger and hurt. The area around your nipples may get darker. Dark spots or blotches may develop on your face. You may have changes in your hair. Health changes You may feel like you might vomit (nauseous), and you may vomit. You may have heartburn. You may have headaches. You may have trouble pooping (constipation). Your gums may bleed. Other changes You may get tired easily. You may pee (urinate) more often. Your menstrual periods will stop. You may not feel hungry. You may want to eat certain kinds of food. You may have changes in your emotions from day to day. You may have more dreams. Follow these instructions at home: Medicines Take over-the-counter and prescription medicines only as told by your doctor. Some medicines are not safe during pregnancy. Take a prenatal vitamin that contains at least 600 micrograms (mcg) of folic acid. Eating and drinking Eat healthy meals that include: Fresh fruits and vegetables. Whole grains. Good sources of protein, such as meat, eggs, or tofu. Low-fat dairy products. Avoid raw meat and unpasteurized juice, milk, and cheese. If you feel like you may vomit, or you vomit: Eat 4 or 5 small meals a day instead of 3 large meals. Try eating a few soda crackers. Drink liquids between meals instead of during meals. You may need to take these actions to prevent or treat trouble pooping: Drink enough fluids to keep your pee (urine) pale yellow. Eat foods that are high in fiber. These include beans, whole grains, and fresh fruits and  vegetables. Limit foods that are high in fat and sugar. These include fried or sweet foods. Activity Exercise only as told by your doctor. Most people can do their usual exercise routine during pregnancy. Stop exercising if you have cramps or pain in your lower belly (abdomen) or low back. Do not exercise if it is too hot or too humid, or if you are in a place of great height (high altitude). Avoid heavy lifting. If you choose to, you may have sex unless your doctor tells you not to. Relieving pain and discomfort Wear a good support bra if your breasts are sore. Rest with your legs raised (elevated) if you have leg cramps or low back pain. If you have bulging veins (varicose veins) in your legs: Wear support hose as told by your doctor. Raise your feet for 15 minutes, 3-4 times a day. Limit salt in your food. Safety Wear your seat belt at all times when you are in a car. Talk with your doctor if someone is hurting you or yelling at you. Talk with your doctor if you are feeling sad or have thoughts of hurting yourself. Lifestyle Do not use hot tubs, steam rooms, or saunas. Do not douche. Do not use tampons or scented sanitary pads. Do not use herbal medicines, illegal drugs, or medicines that are not approved by your doctor. Do not drink alcohol. Do not smoke or use any products that contain nicotine or tobacco. If you need help quitting, ask your doctor. Avoid cat litter boxes and soil that is used by cats. These carry  germs that can cause harm to the baby and can cause a loss of your baby by miscarriage or stillbirth. General instructions Keep all follow-up visits. This is important. Ask for help if you need counseling or if you need help with nutrition. Your doctor can give you advice or tell you where to go for help. Visit your dentist. At home, brush your teeth with a soft toothbrush. Floss gently. Write down your questions. Take them to your prenatal visits. Where to find more  information American Pregnancy Association: americanpregnancy.org [employer]st[employer]ians [employer] Gynecologists: www.acog.org Office on Women's Health: MightyReward.co.nz Contact a doctor if: You are dizzy. You have a fever. You have mild cramps or pressure in your lower belly. You have a nagging pain in your belly area. You continue to feel like you may vomit, you vomit, or you have watery poop (diarrhea) for 24 hours or longer. You have a bad-smelling fluid coming from your vagina. You have pain when you pee. You are exposed to a disease that spreads from person to person, such as chickenpox, measles, Zika virus, HIV, or hepatitis. Get help right away if: You have spotting or bleeding from your vagina. You have very bad belly cramping or pain. You have shortness of breath or chest pain. You have any kind of injury, such as from a fall or a car crash. You have new or increased pain, swelling, or redness in an arm or leg. Summary The first trimester of pregnancy starts on the first day of your last menstrual period until the end of week 12 (months 1 through 3). Eat 4 or 5 small meals a day instead of 3 large meals. Do not smoke or use any products that contain nicotine or tobacco. If you need help quitting, ask your doctor. Keep all follow-up visits. This information is not intended to replace advice given to you by your health care provider. Make sure you discuss any questions you have with your health care provider. Document Revised: 07/06/2019 Document Reviewed: 05/12/2019 Elsevier Patient Education  2024 Elsevier Inc. Common Medications Safe in Pregnancy  Acne:      Constipation:  Benzoyl Peroxide     Colace  Clindamycin      Dulcolax Suppository  Topica Erythromycin     Fibercon  Salicylic Acid      Metamucil         Miralax AVOID:        Senakot   Accutane    Cough:  Retin-A       Cough Drops  Tetracycline      Phenergan w/ Codeine if  Rx  Minocycline      Robitussin (Plain & DM)  Antibiotics:     Crabs/Lice:  Ceclor       RID  Cephalosporins    AVOID:  E-Mycins      Kwell  Keflex  Macrobid/Macrodantin   Diarrhea:  Penicillin      Kao-Pectate  Zithromax      Imodium AD         PUSH FLUIDS AVOID:       Cipro     Fever:  Tetracycline      Tylenol (Regular or Extra  Minocycline       Strength)  Levaquin      Extra Strength-Do not          Exceed 8 tabs/24 hrs Caffeine:        200mg /day (equiv. To 1 cup of coffee or  approx. 3 12 oz  sodas)         Gas: Cold/Hayfever:       Gas-X  Benadryl      Mylicon  Claritin       Phazyme  **Claritin-D        Chlor-Trimeton    Headaches:  Dimetapp      ASA-Free Excedrin  Drixoral-Non-Drowsy     Cold Compress  Mucinex (Guaifenasin)     Tylenol (Regular or Extra  Sudafed/Sudafed-12 Hour     Strength)  **Sudafed PE Pseudoephedrine   Tylenol Cold & Sinus     Vicks Vapor Rub  Zyrtec  **AVOID if Problems With Blood Pressure         Heartburn: Avoid lying down for at least 1 hour after meals  Aciphex      Maalox     Rash:  Milk of Magnesia     Benadryl    Mylanta       1% Hydrocortisone Cream  Pepcid  Pepcid Complete   Sleep Aids:  Prevacid      Ambien   Prilosec       Benadryl  Rolaids       Chamomile Tea  Tums (Limit 4/day)     Unisom         Tylenol PM         Warm milk-add vanilla or  Hemorrhoids:       Sugar for taste  Anusol/Anusol H.C.  (RX: Analapram 2.5%)  Sugar Substitutes:  Hydrocortisone OTC     Ok in moderation  Preparation H      Tucks        Vaseline lotion applied to tissue with wiping    Herpes:     Throat:  Acyclovir      Oragel  Famvir  Valtrex     Vaccines:         Flu Shot Leg Cramps:       *Gardasil  Benadryl      Hepatitis A         Hepatitis B Nasal Spray:       Pneumovax  Saline Nasal Spray     Polio Booster         Tetanus Nausea:       Tuberculosis test or PPD  Vitamin B6 25 mg  TID   AVOID:    Dramamine      *Gardasil  Emetrol       Live Poliovirus  Ginger Root 250 mg QID    MMR (measles, mumps &  High Complex Carbs @ Bedtime    rebella)  Sea Bands-Accupressure    Varicella (Chickenpox)  Unisom 1/2 tab TID     *No known complications           If received before Pain:         Known pregnancy;   Darvocet       Resume series after  Lortab        Delivery  Percocet    Yeast:   Tramadol      Femstat  Tylenol 3      Gyne-lotrimin  Ultram       Monistat  Vicodin           MISC:         All Sunscreens           Hair Coloring/highlights          Insect Repellant's          (Including DEET)  Mystic Tans

## 2023-03-03 ENCOUNTER — Ambulatory Visit
Admission: RE | Admit: 2023-03-03 | Discharge: 2023-03-03 | Disposition: A | Discharge status: Home / Self Care | Payer: Medicaid Other | Source: Ambulatory Visit | Attending: Certified Nurse Midwife | Admitting: Certified Nurse Midwife

## 2023-03-03 DIAGNOSIS — Z3480 Encounter for supervision of other normal pregnancy, unspecified trimester: Secondary | ICD-10-CM | POA: Diagnosis present

## 2023-03-03 DIAGNOSIS — Q513 Bicornate uterus: Secondary | ICD-10-CM | POA: Diagnosis not present

## 2023-03-03 DIAGNOSIS — Z3A11 11 weeks gestation of pregnancy: Secondary | ICD-10-CM | POA: Diagnosis not present

## 2023-03-04 ENCOUNTER — Encounter: Payer: Self-pay | Admitting: Certified Nurse Midwife

## 2023-03-12 ENCOUNTER — Encounter: Payer: Self-pay | Admitting: Certified Nurse Midwife

## 2023-03-12 ENCOUNTER — Other Ambulatory Visit (HOSPITAL_COMMUNITY)
Admission: RE | Admit: 2023-03-12 | Discharge: 2023-03-12 | Disposition: A | Discharge status: Home / Self Care | Payer: Medicaid Other | Source: Ambulatory Visit | Attending: Certified Nurse Midwife | Admitting: Certified Nurse Midwife

## 2023-03-12 ENCOUNTER — Ambulatory Visit (INDEPENDENT_AMBULATORY_CARE_PROVIDER_SITE_OTHER): Payer: Medicaid Other | Admitting: Certified Nurse Midwife

## 2023-03-12 VITALS — BP 109/69 | HR 103 | Wt 125.0 lb

## 2023-03-12 DIAGNOSIS — Z98891 History of uterine scar from previous surgery: Secondary | ICD-10-CM

## 2023-03-12 DIAGNOSIS — Z124 Encounter for screening for malignant neoplasm of cervix: Secondary | ICD-10-CM

## 2023-03-12 DIAGNOSIS — Z3A12 12 weeks gestation of pregnancy: Secondary | ICD-10-CM

## 2023-03-12 DIAGNOSIS — Z3480 Encounter for supervision of other normal pregnancy, unspecified trimester: Secondary | ICD-10-CM

## 2023-03-12 DIAGNOSIS — Z113 Encounter for screening for infections with a predominantly sexual mode of transmission: Secondary | ICD-10-CM | POA: Diagnosis present

## 2023-03-12 DIAGNOSIS — Z114 Encounter for screening for human immunodeficiency virus [HIV]: Secondary | ICD-10-CM

## 2023-03-12 DIAGNOSIS — Z3481 Encounter for supervision of other normal pregnancy, first trimester: Secondary | ICD-10-CM

## 2023-03-12 DIAGNOSIS — O3400 Maternal care for unspecified congenital malformation of uterus, unspecified trimester: Secondary | ICD-10-CM

## 2023-03-12 DIAGNOSIS — Z369 Encounter for antenatal screening, unspecified: Secondary | ICD-10-CM

## 2023-03-12 DIAGNOSIS — Z1379 Encounter for other screening for genetic and chromosomal anomalies: Secondary | ICD-10-CM

## 2023-03-12 NOTE — Progress Notes (Unsigned)
NEW OB HISTORY AND PHYSICAL  SUBJECTIVE:       Stacey Donovan is a 28 y.o. G81P1001 female, Patient's last menstrual period was 12/16/2022., Estimated Date of Delivery: 09/22/23, [redacted]w[redacted]d, presents today for establishment of Prenatal Care. She reports headache and nausea occasionally. Her first pregnancy and birth were complicated by a long labor resulting a Cesarean, with this delivery she reports not feeling supported by physician or staff during her labor and inadequate anesthesia during her Cesarean. She is interested in delivering at [facility] in [place] due to her experience.  Social history Partner/Relationship: Apolinar Junes Living situation: partner & daughter Penni Bombard Work: SAHM Exercise: caring for daughter Substance use: denies  Indications for ASA therapy (per uptodate) One of the following: Previous pregnancy with preeclampsia, especially early onset and with an adverse outcome No Multifetal gestation No Chronic hypertension No Type 1 or 2 diabetes mellitus No Chronic kidney disease No Autoimmune disease (antiphospholipid syndrome, systemic lupus erythematosus) No  Two or more of the following: Nulliparity No Obesity (body mass index >30 kg/m2) No Family history of preeclampsia in mother or sister No Age >=35 years No Sociodemographic characteristics (African American race, low socioeconomic level) No Personal risk factors (eg, previous pregnancy with low birth weight or small for gestational age infant, previous adverse pregnancy outcome [eg, stillbirth], interval >10 years between pregnancies) No   Gynecologic History Patient's last menstrual period was 12/16/2022. Normal Contraception: none Last Pap: 2021. Results were: normal  Obstetric History OB History  Gravida Para Term Preterm AB Living  2 1 1   1   SAB IAB Ectopic Multiple Live Births     0 1    # Outcome Date GA Lbr Len/2nd Weight Sex Type Anes PTL Lv  2 Current           1 Term 03/23/18 [redacted]w[redacted]d  32:46 / 02:25 6 lb 4.5 oz (2.85 kg) F CS-LTranv EPI  LIV    Past Medical History:  Diagnosis Date   Acute appendicitis with generalized peritonitis    Anxiety    Appendicitis 09/04/2016   Hypoglycemia     Past Surgical History:  Procedure Laterality Date   APPENDECTOMY     CESAREAN SECTION N/A 03/23/2018   Procedure: CESAREAN SECTION;  Surgeon: Nadara Mustard, MD;  Location: [facility] ORS;  Service: Obstetrics;  Laterality: N/A;  TOB: 15:11 Sex Female Wt: 6 lb 5 oz     LAPAROSCOPIC APPENDECTOMY N/A 09/04/2016   Procedure: APPENDECTOMY LAPAROSCOPIC;  Surgeon: Ricarda Frame, MD;  Location: [facility] ORS;  Service: General;  Laterality: N/A;    Current Outpatient Medications on File Prior to Visit  Medication Sig Dispense Refill   Prenatal Vit-Fe Fumarate-FA (MULTIVITAMIN-PRENATAL) 27-0.8 MG TABS tablet Take 1 tablet by mouth daily at 12 noon.     No current facility-administered medications on file prior to visit.    No Known Allergies  Social History   Socioeconomic History   Marital status: Single    Spouse name: Apolinar Junes   Number of children: 1   Years of education: 12   Highest education level: 12th grade  Occupational History   Not on file  Tobacco Use   Smoking status: Former    Current packs/day: 1.00    Types: Cigarettes   Smokeless tobacco: Never  Vaping Use   Vaping status: Never Used  Substance and Sexual Activity   Alcohol use: No   Drug use: Not Currently    Types: Marijuana   Sexual activity: Yes    Birth  control/protection: None  Other Topics Concern   Not on file  Social History Narrative   Not on file   Social Drivers of Health   Financial Resource Strain: Low Risk  (02/16/2023)   Overall Financial Resource Strain (CARDIA)    Difficulty of Paying Living Expenses: Not very hard  Food Insecurity: No Food Insecurity (02/16/2023)   Hunger Vital Sign    Worried About Running Out of Food in the Last Year: Never true    Ran Out of Food in the Last Year:  Never true  Transportation Needs: No Transportation Needs (02/16/2023)   PRAPARE - Administrator, Civil Service (Medical): No    Lack of Transportation (Non-Medical): No  Physical Activity: Sufficiently Active (02/16/2023)   Exercise Vital Sign    Days of Exercise per Week: 2 days    Minutes of Exercise per Session: 120 min  Stress: No Stress Concern Present (02/16/2023)   [employer] of Occupational Health - Occupational Stress Questionnaire    Feeling of Stress : Only a little  Social Connections: Moderately Integrated (02/16/2023)   Social Connection and Isolation Panel [NHANES]    Frequency of Communication with Friends and Family: More than three times a week    Frequency of Social Gatherings with Friends and Family: Once a week    Attends Religious Services: More than 4 times per year    Active Member of [employer] or Organizations: No    Attends Banker Meetings: Never    Marital Status: Living with partner  Intimate Partner Violence: Not At Risk (02/16/2023)   Humiliation, Afraid, Rape, and Kick questionnaire    Fear of Current or Ex-Partner: No    Emotionally Abused: No    Physically Abused: No    Sexually Abused: No    Family History  Problem Relation Age of Onset   Healthy Mother    Healthy Father    Healthy Brother    Breast cancer Maternal Grandmother    Healthy Maternal Grandfather    Healthy Paternal Grandmother     The following portions of the patient's history were reviewed and updated as appropriate: allergies, current medications, past OB history, past medical history, past surgical history, past family history, past social history, and problem list.  Constitutional: Denied constitutional symptoms, night sweats, recent illness, fatigue, fever, insomnia and weight loss.  Eyes: Denied eye symptoms, eye pain, photophobia, vision change and visual disturbance.  Ears/Nose/Throat/Neck: Denied ear, nose, throat or neck symptoms, hearing loss,  nasal discharge, sinus congestion and sore throat.  Cardiovascular: Denied cardiovascular symptoms, arrhythmia, chest pain/pressure, edema, exercise intolerance, orthopnea and palpitations.  Respiratory: Denied pulmonary symptoms, asthma, pleuritic pain, productive sputum, cough, dyspnea and wheezing.  Gastrointestinal: Denied gastro-esophageal reflux, melena  Genitourinary: Denied genitourinary symptoms including symptomatic vaginal discharge, pelvic relaxation issues, and urinary complaints.  Musculoskeletal: Denied musculoskeletal symptoms, stiffness, swelling, muscle weakness and myalgia.  Dermatologic: Denied dermatology symptoms, rash and scar.  Neurologic: Denied neurology symptoms, dizziness, neck pain and syncope.  Psychiatric: Denied psychiatric symptoms, anxiety and depression.  Endocrine: Denied endocrine symptoms including hot flashes and night sweats.     OBJECTIVE: Initial Physical Exam (New OB)  GENERAL APPEARANCE: alert, well appearing, in no apparent distress, oriented to person, place and time HEAD: normocephalic, atraumatic THYROID: no thyromegaly or masses present BREASTS: no masses noted, no significant tenderness, no palpable axillary nodes, no skin changes LUNGS: clear to auscultation, no wheezes, rales or rhonchi, symmetric air entry HEART: regular rate and rhythm, no  murmurs ABDOMEN: soft, nontender, nondistended, no abnormal masses, no epigastric pain SKIN: normal coloration and turgor, no rashes NEUROLOGIC: alert, oriented, normal speech, no focal findings or movement disorder noted PELVIC EXAM EXTERNAL GENITALIA: normal appearing vulva with no masses, tenderness or lesions VAGINA: no abnormal discharge or lesions CERVIX: visually closed, pap collected, friability present FHR: 165  ASSESSMENT: Normal pregnancy   PLAN: Routine prenatal care. We discussed an overview of prenatal care and when to call. Reviewed diet, exercise, and weight gain  recommendations in pregnancy. Discussed benefits of breastfeeding and lactation resources at [facility]. I reviewed labs and answered all questions.  1. Supervision of other normal pregnancy, antepartum (Primary) - NOB Panel; Future - Culture, OB Urine - Monitor Drug Profile 14(MW) - Nicotine screen, urine - Urinalysis, Routine w reflex microscopic - Cytology - PAP - MaterniT 21 plus Core, Blood - Hemoglobinopathy evaluation - NOB Panel - Cervicovaginal ancillary only  2. Bicornuate uterus affecting pregnancy, antepartum  3. [redacted] weeks gestation of pregnancy - NOB Panel; Future - Culture, OB Urine - NOB Panel  4. Antenatal screening encounter - NOB Panel; Future - MaterniT 21 plus Core, Blood - NOB Panel  5. Genetic screening  6. Screening for human immunodeficiency virus - NOB Panel; Future - NOB Panel  7. Screen for sexually transmitted diseases - Cervicovaginal ancillary only  8. History of cesarean delivery  9. Cervical cancer screening - Cytology - PAP   Dominica Severin, CNM

## 2023-03-13 LAB — URINALYSIS, ROUTINE W REFLEX MICROSCOPIC
Bilirubin, UA: NEGATIVE
Glucose, UA: NEGATIVE
Ketones, UA: NEGATIVE
Leukocytes,UA: NEGATIVE
Nitrite, UA: NEGATIVE
Specific Gravity, UA: 1.028 (ref 1.005–1.030)
Urobilinogen, Ur: 1 mg/dL (ref 0.2–1.0)
pH, UA: 5.5 (ref 5.0–7.5)

## 2023-03-13 LAB — MICROSCOPIC EXAMINATION
Casts: NONE SEEN /[LPF]
WBC, UA: NONE SEEN /[HPF] (ref 0–5)

## 2023-03-14 LAB — CBC/D/PLT+RPR+RH+ABO+RUBIGG...
Antibody Screen: NEGATIVE
Basophils Absolute: 0 10*3/uL (ref 0.0–0.2)
Basos: 1 %
EOS (ABSOLUTE): 0.1 10*3/uL (ref 0.0–0.4)
Eos: 1 %
HCV Ab: NONREACTIVE
HIV Screen 4th Generation wRfx: NONREACTIVE
Hematocrit: 37.7 % (ref 34.0–46.6)
Hemoglobin: 12.4 g/dL (ref 11.1–15.9)
Hepatitis B Surface Ag: NEGATIVE
Immature Grans (Abs): 0 10*3/uL (ref 0.0–0.1)
Immature Granulocytes: 0 %
Lymphocytes Absolute: 1.2 10*3/uL (ref 0.7–3.1)
Lymphs: 13 %
MCH: 28.1 pg (ref 26.6–33.0)
MCHC: 32.9 g/dL (ref 31.5–35.7)
MCV: 85 fL (ref 79–97)
Monocytes Absolute: 0.5 10*3/uL (ref 0.1–0.9)
Monocytes: 6 %
Neutrophils Absolute: 6.9 10*3/uL (ref 1.4–7.0)
Neutrophils: 79 %
Platelets: 351 10*3/uL (ref 150–450)
RBC: 4.42 x10E6/uL (ref 3.77–5.28)
RDW: 13.1 % (ref 11.7–15.4)
RPR Ser Ql: NONREACTIVE
Rh Factor: POSITIVE
Rubella Antibodies, IGG: 1.46 {index} (ref >0.99)
Varicella zoster IgG: REACTIVE
WBC: 8.8 10*3/uL (ref 3.4–10.8)

## 2023-03-14 LAB — MONITOR DRUG PROFILE 14(MW)
Amphetamine Scrn, Ur: NEGATIVE ng/mL
BARBITURATE SCREEN URINE: NEGATIVE ng/mL
BENZODIAZEPINE SCREEN, URINE: NEGATIVE ng/mL
Buprenorphine, Urine: NEGATIVE ng/mL
CANNABINOIDS UR QL SCN: NEGATIVE ng/mL
Cocaine (Metab) Scrn, Ur: NEGATIVE ng/mL
Creatinine(Crt), U: 163.7 mg/dL (ref 20.0–300.0)
Fentanyl, Urine: NEGATIVE pg/mL
Meperidine Screen, Urine: NEGATIVE ng/mL
Methadone Screen, Urine: NEGATIVE ng/mL
OXYCODONE+OXYMORPHONE UR QL SCN: NEGATIVE ng/mL
Opiate Scrn, Ur: NEGATIVE ng/mL
Ph of Urine: 5.2 (ref 4.5–8.9)
Phencyclidine Qn, Ur: NEGATIVE ng/mL
Propoxyphene Scrn, Ur: NEGATIVE ng/mL
SPECIFIC GRAVITY: 1.028
Tramadol Screen, Urine: NEGATIVE ng/mL

## 2023-03-14 LAB — NICOTINE SCREEN, URINE: Cotinine Ql Scrn, Ur: NEGATIVE ng/mL

## 2023-03-14 LAB — HCV INTERPRETATION

## 2023-03-14 LAB — URINE CULTURE, OB REFLEX

## 2023-03-14 LAB — CULTURE, OB URINE

## 2023-03-15 ENCOUNTER — Encounter: Payer: Self-pay | Admitting: Certified Nurse Midwife

## 2023-03-15 MED ORDER — PRENATAL VITAMIN 27-0.8 MG PO TABS: 1.0000 | ORAL_TABLET | Freq: Every day | ORAL | 3 refills | Status: DC

## 2023-03-15 MED ORDER — ONDANSETRON 4 MG PO TBDP: 4.0000 mg | ORAL_TABLET | Freq: Three times a day (TID) | ORAL | 1 refills | Status: DC | PRN

## 2023-03-16 LAB — MATERNIT 21 PLUS CORE, BLOOD
Fetal Fraction: 13
Result (T21): NEGATIVE
Trisomy 13 (Patau syndrome): NEGATIVE
Trisomy 18 (Edwards syndrome): NEGATIVE
Trisomy 21 (Down syndrome): NEGATIVE

## 2023-03-16 LAB — CERVICOVAGINAL ANCILLARY ONLY
Chlamydia: NEGATIVE
Comment: NEGATIVE
Comment: NEGATIVE
Comment: NORMAL
Neisseria Gonorrhea: NEGATIVE
Trichomonas: NEGATIVE

## 2023-03-16 LAB — HGB FRACTIONATION CASCADE
Hgb A2: 2.5 % (ref 1.8–3.2)
Hgb A: 97.5 % (ref 96.4–98.8)
Hgb F: 0 % (ref 0.0–2.0)
Hgb S: 0 %

## 2023-03-17 ENCOUNTER — Encounter: Payer: Self-pay | Admitting: Certified Nurse Midwife

## 2023-03-18 LAB — CYTOLOGY - PAP: Diagnosis: NEGATIVE

## 2023-04-09 ENCOUNTER — Encounter: Payer: Self-pay | Admitting: Obstetrics

## 2023-04-09 ENCOUNTER — Ambulatory Visit (INDEPENDENT_AMBULATORY_CARE_PROVIDER_SITE_OTHER): Payer: Medicaid Other | Admitting: Obstetrics

## 2023-04-09 VITALS — BP 114/67 | HR 70 | Wt 133.4 lb

## 2023-04-09 DIAGNOSIS — Z3689 Encounter for other specified antenatal screening: Secondary | ICD-10-CM

## 2023-04-09 DIAGNOSIS — Z98891 History of uterine scar from previous surgery: Secondary | ICD-10-CM | POA: Insufficient documentation

## 2023-04-09 DIAGNOSIS — Z3A16 16 weeks gestation of pregnancy: Secondary | ICD-10-CM

## 2023-04-09 DIAGNOSIS — Z3480 Encounter for supervision of other normal pregnancy, unspecified trimester: Secondary | ICD-10-CM

## 2023-04-09 NOTE — Assessment & Plan Note (Addendum)
-  Plans RCS but undecided if she will birth at [facility] or in [place]. Aware that we cannot schedule CS in [place]. -Will schedule next visit with MD -Will decide about location of birth by next visit

## 2023-04-09 NOTE — Assessment & Plan Note (Signed)
-  Discussed healthy eating habits and activity during pregnancy -Anatomy US ordered

## 2023-04-09 NOTE — Progress Notes (Signed)
    Return Prenatal Note   Assessment/Plan   Plan  28 y.o. G2P1001 at [redacted]w[redacted]d presents for follow-up OB visit. Reviewed prenatal record including previous visit note.  History of cesarean delivery -Plans RCS but undecided if she will birth at [facility] or in [place] -Will schedule next visit with MD -Will decide about location of birth by next visit  Supervision of other normal pregnancy, antepartum -Discussed healthy eating habits and activity during pregnancy -Anatomy US ordered   Orders Placed This Encounter  Procedures   US OB Comp + 14 Wk    Standing Status:   Future    Expected Date:   05/07/2023    Expiration Date:   04/08/2024    Reason for Exam (SYMPTOM  OR DIAGNOSIS REQUIRED):   anatomy    Preferred Imaging Location?:   Internal   Return in about 4 weeks (around 05/07/2023).   No future appointments.  For next visit:  Routine prenatal care w/MD    Subjective   Shakeita is feeling is exhausted. She is getting decent sleep at night. She is feeling anxious about her birth. She plans a repeat CS but had a difficult time with her first d/t feeling unsupported, having significant pain during her surgery. She is considering transferring to [facility] but wants to stay at [facility].  Movement: Present Contractions: Not present  Objective   Flow sheet Vitals: Pulse Rate: 70 BP: 114/67 Fetal Heart Rate (bpm): 158 Total weight gain: 23 lb 6.4 oz (10.6 kg)  General Appearance  No acute distress, well appearing, and well nourished Pulmonary   Normal work of breathing Neurologic   Alert and oriented to person, place, and time Psychiatric   Mood and affect within normal limits  Guadlupe Spanish, CNM 04/09/23 4:05 PM

## 2023-05-06 ENCOUNTER — Other Ambulatory Visit: Payer: Self-pay | Admitting: Obstetrics

## 2023-05-06 ENCOUNTER — Ambulatory Visit (INDEPENDENT_AMBULATORY_CARE_PROVIDER_SITE_OTHER): Payer: Medicaid Other | Admitting: Obstetrics

## 2023-05-06 ENCOUNTER — Ambulatory Visit: Payer: Medicaid Other

## 2023-05-06 VITALS — BP 111/72 | HR 89 | Wt 140.0 lb

## 2023-05-06 DIAGNOSIS — Z98891 History of uterine scar from previous surgery: Secondary | ICD-10-CM

## 2023-05-06 DIAGNOSIS — O3402 Maternal care for unspecified congenital malformation of uterus, second trimester: Secondary | ICD-10-CM

## 2023-05-06 DIAGNOSIS — Q513 Bicornate uterus: Secondary | ICD-10-CM | POA: Diagnosis not present

## 2023-05-06 DIAGNOSIS — Z3689 Encounter for other specified antenatal screening: Secondary | ICD-10-CM

## 2023-05-06 DIAGNOSIS — Z3A16 16 weeks gestation of pregnancy: Secondary | ICD-10-CM

## 2023-05-06 DIAGNOSIS — Z3A20 20 weeks gestation of pregnancy: Secondary | ICD-10-CM

## 2023-05-06 DIAGNOSIS — O34219 Maternal care for unspecified type scar from previous cesarean delivery: Secondary | ICD-10-CM | POA: Diagnosis not present

## 2023-05-06 DIAGNOSIS — Z363 Encounter for antenatal screening for malformations: Secondary | ICD-10-CM

## 2023-05-06 DIAGNOSIS — O3400 Maternal care for unspecified congenital malformation of uterus, unspecified trimester: Secondary | ICD-10-CM

## 2023-05-06 DIAGNOSIS — Z3480 Encounter for supervision of other normal pregnancy, unspecified trimester: Secondary | ICD-10-CM

## 2023-05-06 NOTE — Progress Notes (Signed)
    Return Prenatal Note   Subjective  29 y.o. G2P1001 at [redacted]w[redacted]d presents for this follow-up prenatal visit. Pregnancy notable for bicornuate uterus, pregnancy in left horn, and prior CD x 1 desiring repeat.   Patient today here with her grandmother. Wanting to discuss rCD, had a lot of birth trauma from first delivery and unsure if she desires [facility]. Had anatomy US earlier today, enjoyed seeing baby.   Patient reports: Movement: Present Contractions: Not present Denies vaginal bleeding or leaking fluid. Objective  Flow sheet Vitals: Pulse Rate: 89 BP: 111/72 Fundal Height: 20 cm Fetal Heart Rate (bpm): 144 Total weight gain: 30 lb (13.6 kg)  General Appearance  No acute distress, well appearing, and well nourished Pulmonary   Normal work of breathing Neurologic   Alert and oriented to person, place, and time Psychiatric   Mood and affect within normal limits  Assessment/Plan   Plan  28 y.o. G2P1001 at [redacted]w[redacted]d by LMP=11wk [place] presents for follow-up OB visit. Reviewed prenatal record including previous visit note.  1. Bicornuate uterus affecting pregnancy, antepartum (Primary) -Anatomy US complete today -Will plan for growth [place] at 32wks, sooner if indicated by S<D  2. History of cesarean delivery -Plan for repeat, we had a long discussion about her prior experience and reassurance that we will try to make this experience different/better. After talking, pt feels comfortable to stay with [facility] and deliver at [facility].  -Will schedule once she is closer to 3rd trimester, aware we can schedule between 39-39.6, not earlier.   Return in about 4 weeks (around 06/03/2023) for Rob.   Future Appointments  Date Time Provider Department Center  06/03/2023  2:35 PM Dominic, Courtney Heys, CNM [facility] None   For next visit:  continue with routine prenatal care    Julieanne Manson, DO [facility]

## 2023-05-06 NOTE — Patient Instructions (Signed)
 Second Trimester of Pregnancy  The second trimester of pregnancy is from week 14 through week 27. This is months 4 through 6 of pregnancy. During the second trimester: Morning sickness is less or has stopped. You may have more energy. You may feel hungry more often. At this time, your unborn baby is growing very fast. At the end of the sixth month, the unborn baby may be up to 12 inches long and weigh about 1 pounds. You will likely start to feel the baby move between 16 and 20 weeks of pregnancy. Body changes during your second trimester Your body continues to change during this time. The changes usually go away after your baby is born. Physical changes You will gain more weight. Your belly will get bigger. You may begin to get stretch marks on your hips, belly, and breasts. Your breasts will keep growing and may hurt. You may get dark spots or blotches on your face. A dark line from your belly button to the pubic area may appear. This line is called linea nigra. Your hair may grow faster and get thicker. Health changes You may have headaches. You may have heartburn. You may pee more often. You may have swollen, bulging veins (varicose veins). You may have trouble pooping (constipation), or swollen veins in the butt that can itch or get painful (hemorrhoids). You may have back pain. This is caused by: Weight gain. Pregnancy hormones that are relaxing the joints in your pelvis. Follow these instructions at home: Medicines Talk to your health care provider if you're taking medicines. Ask if the medicines are safe to take during pregnancy. Your provider may change the medicines that you take. Do not take any medicines unless told to by your provider. Take a prenatal vitamin that has at least 600 micrograms (mcg) of folic acid. Do not use herbal medicines, illegal drugs, or medicines that are not approved by your provider. Eating and drinking While you're pregnant your body needs  extra food for your growing baby. Talk with your provider about what to eat while pregnant. Activity Most women are able to exercise during pregnancy. Exercises may need to change as your pregnancy goes on. Talk to your provider about your activities and exercise routines. Relieving pain and discomfort Wear a good, supportive bra if your breasts hurt. Rest with your legs raised if you have leg cramps or low back pain. Take warm sitz baths to soothe pain from hemorrhoids. Use hemorrhoid cream if your provider says it's okay. Do not douche. Do not use tampons or scented pads. Do not use hot tubs, steam rooms, or saunas. Safety Wear your seatbelt at all times when you're in a car. Talk to your provider if someone hits you, hurts you, or yells at you. Talk with your provider if you're feeling sad or have thoughts of hurting yourself. Lifestyle Certain things can be harmful while you're pregnant. It's best to avoid the following: Do not drink alcohol,smoke, vape, or use products with nicotine or tobacco in them. If you need help quitting, talk with your provider. Avoid cat litter boxes and soil used by cats. These things carry germs that can cause harm to your pregnancy and your baby. General instructions Keep all follow-up visits. It helps you and your unborn baby stay as healthy as possible. Write down your questions. Take them to your prenatal visits. Your provider will: Talk with you about your overall health. Give you advice or refer you to specialists who can help with different needs,  including: Prenatal education classes. Mental health and counseling. Foods and healthy eating. Ask for help if you need help with food. Where to find more information American Pregnancy Association: americanpregnancy.org [employer]: acog.org Office on [employer] Health: [employer] Contact a health care provider if: You have a headache that does not go away  when you take medicine. You have any of these problems: You can't eat or drink. You throw up or feel like you may throw up. You have watery poop (diarrhea) for 2 days or more. You have pain when you pee or your pee smells bad. You have been sick for 2 days or more and are not getting better. Contact your provider right away if: You have any of these coming from your vagina: Abnormal discharge. Bad-smelling fluid. Bleeding. Your baby is moving less than usual. You have contractions, belly cramping, or have pain in your pelvis or lower back. You have symptoms of high blood pressure or preeclampsia. These include: A severe, throbbing headache that does not go away. Sudden or extreme swelling of your face, hands, legs, or feet. Vision problems: You see spots. You have blurry vision. Your eyes are sensitive to light. If you can't reach the provider, go to an urgent care or emergency room. Get help right away if: You faint, become confused, or can't think clearly. You have chest pain or trouble breathing. You have any kind of injury, such as from a fall or a car crash. These symptoms may be an emergency. Call 911 right away. Do not wait to see if the symptoms will go away. Do not drive yourself to the hospital. This information is not intended to replace advice given to you by your health care provider. Make sure you discuss any questions you have with your health care provider. Document Revised: 10/30/2022 Document Reviewed: 05/30/2022 Elsevier Patient Education  2024 [employer].

## 2023-05-21 ENCOUNTER — Other Ambulatory Visit: Payer: Self-pay | Admitting: Obstetrics

## 2023-05-21 ENCOUNTER — Encounter: Payer: Self-pay | Admitting: Obstetrics

## 2023-05-21 DIAGNOSIS — Z362 Encounter for other antenatal screening follow-up: Secondary | ICD-10-CM

## 2023-05-21 NOTE — Progress Notes (Signed)
 F/u anatomy scan ordered.  Glenetta Borg, CNM

## 2023-06-03 ENCOUNTER — Encounter: Payer: Self-pay | Admitting: Licensed Practical Nurse

## 2023-06-03 ENCOUNTER — Ambulatory Visit (INDEPENDENT_AMBULATORY_CARE_PROVIDER_SITE_OTHER): Admitting: Licensed Practical Nurse

## 2023-06-03 VITALS — BP 111/72 | HR 83 | Wt 148.0 lb

## 2023-06-03 DIAGNOSIS — Z3A24 24 weeks gestation of pregnancy: Secondary | ICD-10-CM | POA: Diagnosis not present

## 2023-06-03 DIAGNOSIS — Z3480 Encounter for supervision of other normal pregnancy, unspecified trimester: Secondary | ICD-10-CM

## 2023-06-03 DIAGNOSIS — Z131 Encounter for screening for diabetes mellitus: Secondary | ICD-10-CM | POA: Diagnosis not present

## 2023-06-03 NOTE — Progress Notes (Signed)
    Return Prenatal Note   Subjective   28 y.o. G2P1001 at [redacted]w[redacted]d presents for this follow-up prenatal visit.  Patient doing well. Mood has been good. Magnesium has been helping with headaches. Has hip pain will try heat. Breastfed/pumped for 4 months with her daughter, hopes to breastfeed for longer this time.  Patient reports: Movement: Present Contractions: Irritability  Objective   Flow sheet Vitals: Pulse Rate: 83 BP: 111/72 Fundal Height: 25 cm Fetal Heart Rate (bpm): 150 Total weight gain: 38 lb (17.2 kg)  General Appearance  No acute distress, well appearing, and well nourished Pulmonary   Normal work of breathing Neurologic   Alert and oriented to person, place, and time Psychiatric   Mood and affect within normal limits  Assessment/Plan   Plan  28 y.o. G2P1001 at [redacted]w[redacted]d presents for follow-up OB visit. Reviewed prenatal record including previous visit note.  Supervision of other normal pregnancy, antepartum -desires tubal ligation, will sign consent at next visit -reviewed 28wk labs and TDAP next visit [place] to the beach, precautions given  -fu anatomy US  next week      Orders Placed This Encounter  Procedures   28 Week RH+Panel    Standing Status:   Future    Expected Date:   07/03/2023    Expiration Date:   06/02/2024   Return in about 4 weeks (around 07/01/2023) for ROB, 28wks labs.   Future Appointments  Date Time Provider Department Center  06/08/2023  1:00 PM [facility]  3 [facility]  07/01/2023  2:35 PM Phylliss Brenner, CNM [facility] None    For next visit:  ROB with 1 hour glucola, third trimester labs, and Tdap     Berkley Breech [facility], CNM  04/23/254:05 PM

## 2023-06-03 NOTE — Assessment & Plan Note (Addendum)
-  desires tubal ligation, will sign consent at next visit -reviewed 28wk labs and TDAP next visit -traveling to the beach, precautions given  -fu anatomy US  next week

## 2023-06-08 ENCOUNTER — Ambulatory Visit
Admission: RE | Admit: 2023-06-08 | Discharge: 2023-06-08 | Disposition: A | Discharge status: Home / Self Care | Source: Ambulatory Visit | Attending: Obstetrics | Admitting: Obstetrics

## 2023-06-08 DIAGNOSIS — Z362 Encounter for other antenatal screening follow-up: Secondary | ICD-10-CM

## 2023-06-21 ENCOUNTER — Other Ambulatory Visit: Payer: Self-pay | Admitting: Licensed Practical Nurse

## 2023-06-21 DIAGNOSIS — Q513 Bicornate uterus: Secondary | ICD-10-CM

## 2023-06-21 DIAGNOSIS — Z3480 Encounter for supervision of other normal pregnancy, unspecified trimester: Secondary | ICD-10-CM

## 2023-06-21 NOTE — Progress Notes (Signed)
 Pt with bicornuate uterus needs monthly growth US , US  ordered  Anice Kerbs, CNM  [facility]  06/21/23  5:13 PM

## 2023-06-29 ENCOUNTER — Telehealth: Payer: Self-pay | Admitting: Obstetrics

## 2023-06-29 NOTE — Telephone Encounter (Signed)
 Contacted the patient x 2. Left message about needing to schedule her one hour glucose appointment with her appointment that is scheduled for 2/21 with Melissa Swanson.

## 2023-06-30 NOTE — Telephone Encounter (Signed)
 Spoke with the patient and she is aware

## 2023-07-01 ENCOUNTER — Encounter: Payer: Self-pay | Admitting: Obstetrics

## 2023-07-01 ENCOUNTER — Other Ambulatory Visit

## 2023-07-01 ENCOUNTER — Ambulatory Visit (INDEPENDENT_AMBULATORY_CARE_PROVIDER_SITE_OTHER): Admitting: Obstetrics

## 2023-07-01 VITALS — BP 99/62 | HR 87 | Wt 153.0 lb

## 2023-07-01 DIAGNOSIS — O34219 Maternal care for unspecified type scar from previous cesarean delivery: Secondary | ICD-10-CM | POA: Diagnosis not present

## 2023-07-01 DIAGNOSIS — Z3A28 28 weeks gestation of pregnancy: Secondary | ICD-10-CM

## 2023-07-01 DIAGNOSIS — Z3480 Encounter for supervision of other normal pregnancy, unspecified trimester: Secondary | ICD-10-CM

## 2023-07-01 DIAGNOSIS — Z98891 History of uterine scar from previous surgery: Secondary | ICD-10-CM

## 2023-07-01 DIAGNOSIS — O3400 Maternal care for unspecified congenital malformation of uterus, unspecified trimester: Secondary | ICD-10-CM

## 2023-07-01 DIAGNOSIS — Z131 Encounter for screening for diabetes mellitus: Secondary | ICD-10-CM

## 2023-07-01 DIAGNOSIS — O3403 Maternal care for unspecified congenital malformation of uterus, third trimester: Secondary | ICD-10-CM | POA: Diagnosis not present

## 2023-07-01 NOTE — Progress Notes (Signed)
    Return Prenatal Note   Assessment/Plan   Plan  28 y.o. G2P1001 at [redacted]w[redacted]d presents for follow-up OB visit. Reviewed prenatal record including previous visit note.  History of cesarean delivery -Desires RCS w/BTL. Will schedule next visit with MD for delivery planning  Bicornuate uterus affecting pregnancy, antepartum -Growth US  07/13/23  Supervision of other normal pregnancy, antepartum -28-week labs today -Reviewed kick counts and preterm labor warning signs. Instructed to call office or come to hospital with persistent headache, vision changes, regular contractions, leaking of fluid, decreased fetal movement or vaginal bleeding.     No orders of the defined types were placed in this encounter.  Return in about 2 weeks (around 07/15/2023).   Future Appointments  Date Time Provider Department Center  07/13/2023 11:00 AM [facility]  [facility]  [facility]  07/15/2023  3:35 PM Sofia Dunn, MD [facility] None    For next visit:  Routine prenatal care    Subjective   Stacey Donovan had 2 days of strong BH ctx that resolved with rest. She has been busy working in the garden, caring for goats, and homeschooling. She is excited to meet this baby and is working through the feelings of going from one child to two.   Movement: Present Contractions: Irritability  Objective   Flow sheet Vitals: Pulse Rate: 87 BP: 99/62 Fundal Height: 29 cm Fetal Heart Rate (bpm): 148 Total weight gain: 43 lb (19.5 kg)  General Appearance  No acute distress, well appearing, and well nourished Pulmonary   Normal work of breathing Neurologic   Alert and oriented to person, place, and time Psychiatric   Mood and affect within normal limits  Josue Nip, CNM 07/01/23 3:27 PM

## 2023-07-01 NOTE — Assessment & Plan Note (Addendum)
-  Desires RCS w/BTL. Will schedule next visit with MD for delivery planning

## 2023-07-01 NOTE — Assessment & Plan Note (Signed)
-  28-week labs today -Reviewed kick counts and preterm labor warning signs. Instructed to call office or come to hospital with persistent headache, vision changes, regular contractions, leaking of fluid, decreased fetal movement or vaginal bleeding.

## 2023-07-01 NOTE — Assessment & Plan Note (Signed)
-  Growth US  07/13/23

## 2023-07-02 LAB — 28 WEEK RH+PANEL
Basophils Absolute: 0 10*3/uL (ref 0.0–0.2)
Basos: 0 %
EOS (ABSOLUTE): 0.1 10*3/uL (ref 0.0–0.4)
Eos: 1 %
Gestational Diabetes Screen: 79 mg/dL (ref 70–139)
HIV Screen 4th Generation wRfx: NONREACTIVE
Hematocrit: 31.9 % — ABNORMAL LOW (ref 34.0–46.6)
Hemoglobin: 10.5 g/dL — ABNORMAL LOW (ref 11.1–15.9)
Immature Grans (Abs): 0 10*3/uL (ref 0.0–0.1)
Immature Granulocytes: 0 %
Lymphocytes Absolute: 1.6 10*3/uL (ref 0.7–3.1)
Lymphs: 17 %
MCH: 27.7 pg (ref 26.6–33.0)
MCHC: 32.9 g/dL (ref 31.5–35.7)
MCV: 84 fL (ref 79–97)
Monocytes Absolute: 0.6 10*3/uL (ref 0.1–0.9)
Monocytes: 6 %
Neutrophils Absolute: 7.4 10*3/uL — ABNORMAL HIGH (ref 1.4–7.0)
Neutrophils: 76 %
Platelets: 358 10*3/uL (ref 150–450)
RBC: 3.79 x10E6/uL (ref 3.77–5.28)
RDW: 12 % (ref 11.7–15.4)
RPR Ser Ql: NONREACTIVE
WBC: 9.7 10*3/uL (ref 3.4–10.8)

## 2023-07-03 ENCOUNTER — Ambulatory Visit: Payer: Self-pay | Admitting: Licensed Practical Nurse

## 2023-07-13 ENCOUNTER — Ambulatory Visit
Admission: RE | Admit: 2023-07-13 | Discharge: 2023-07-13 | Disposition: A | Discharge status: Home / Self Care | Source: Ambulatory Visit | Attending: Licensed Practical Nurse | Admitting: Licensed Practical Nurse

## 2023-07-13 DIAGNOSIS — Z3A29 29 weeks gestation of pregnancy: Secondary | ICD-10-CM | POA: Insufficient documentation

## 2023-07-13 DIAGNOSIS — Q513 Bicornate uterus: Secondary | ICD-10-CM | POA: Insufficient documentation

## 2023-07-13 DIAGNOSIS — Z3480 Encounter for supervision of other normal pregnancy, unspecified trimester: Secondary | ICD-10-CM | POA: Diagnosis present

## 2023-07-15 ENCOUNTER — Ambulatory Visit (INDEPENDENT_AMBULATORY_CARE_PROVIDER_SITE_OTHER): Admitting: Obstetrics

## 2023-07-15 VITALS — BP 109/70 | HR 87 | Wt 154.0 lb

## 2023-07-15 DIAGNOSIS — O34219 Maternal care for unspecified type scar from previous cesarean delivery: Secondary | ICD-10-CM | POA: Diagnosis not present

## 2023-07-15 DIAGNOSIS — Q513 Bicornate uterus: Secondary | ICD-10-CM

## 2023-07-15 DIAGNOSIS — O3400 Maternal care for unspecified congenital malformation of uterus, unspecified trimester: Secondary | ICD-10-CM

## 2023-07-15 DIAGNOSIS — Z98891 History of uterine scar from previous surgery: Secondary | ICD-10-CM

## 2023-07-15 DIAGNOSIS — O3403 Maternal care for unspecified congenital malformation of uterus, third trimester: Secondary | ICD-10-CM | POA: Diagnosis not present

## 2023-07-15 DIAGNOSIS — Z3480 Encounter for supervision of other normal pregnancy, unspecified trimester: Secondary | ICD-10-CM

## 2023-07-15 DIAGNOSIS — Z3A30 30 weeks gestation of pregnancy: Secondary | ICD-10-CM

## 2023-07-15 NOTE — Progress Notes (Unsigned)
    Return Prenatal Note   Subjective  28 y.o. G2P1001 at [redacted]w[redacted]d presents for this follow-up prenatal visit. Pregnancy notable for bicornuate uterus, pregnancy in left horn, and prior CD x 1 desiring repeat.   Wanting to discuss delivery planning, desires rCD and is requesting 8/5 when she will be [redacted]w[redacted]d. Is otherwise well and without acute concerns today.   Patient reports: Movement: Present Contractions: Irritability Denies vaginal bleeding or leaking fluid. Objective  Flow sheet Vitals: Pulse Rate: 87 BP: 109/70 Fundal Height: 30 cm Fetal Heart Rate (bpm): 151 Total weight gain: 44 lb (20 kg)  General Appearance  No acute distress, well appearing, and well nourished Pulmonary   Normal work of breathing Neurologic   Alert and oriented to person, place, and time Psychiatric   Mood and affect within normal limits  Assessment/Plan   Plan  28 y.o. G2P1001 at [redacted]w[redacted]d by LMP=11wk US  presents for follow-up OB visit. Reviewed prenatal record including previous visit note.  1. Bicornuate uterus affecting pregnancy, antepartum (Primary) -Growth at [redacted]w[redacted]d US  appropriate, 46%ile -Will plan for growth US  at 36wks, sooner if indicated; US  ordered  2. History of cesarean delivery -Plan for repeat, pt is requesting 8/5. We discussed that I may be out of office at that time. -Will coordinate with the other physicians and update her once scheduled; she prefers female, but open to female; date of 8/5 is more important to her.   Return in about 2 weeks (around 07/29/2023) for ROB.   Future Appointments  Date Time Provider Department Center  07/29/2023  2:35 PM [place], Sherline Distel, CNM [facility] None   For next visit:  continue with routine prenatal care    Sofia Dunn, DO [facility]

## 2023-07-29 ENCOUNTER — Encounter: Payer: Self-pay | Admitting: Certified Nurse Midwife

## 2023-07-29 ENCOUNTER — Ambulatory Visit (INDEPENDENT_AMBULATORY_CARE_PROVIDER_SITE_OTHER): Admitting: Certified Nurse Midwife

## 2023-07-29 VITALS — BP 117/86 | HR 110 | Wt 158.0 lb

## 2023-07-29 DIAGNOSIS — Z3483 Encounter for supervision of other normal pregnancy, third trimester: Secondary | ICD-10-CM | POA: Diagnosis not present

## 2023-07-29 DIAGNOSIS — Z3A32 32 weeks gestation of pregnancy: Secondary | ICD-10-CM

## 2023-07-29 DIAGNOSIS — Z3480 Encounter for supervision of other normal pregnancy, unspecified trimester: Secondary | ICD-10-CM

## 2023-07-29 NOTE — Progress Notes (Signed)
    Return Prenatal Note   Subjective   28 y.o. G2P1001 at [redacted]w[redacted]d presents for this follow-up prenatal visit.  Patient has been having some severe pain in her hips. She notes swelling at the end of the day. Patient reports: Movement: Present Contractions: Not present  Objective   Flow sheet Vitals: Pulse Rate: (!) 110 BP: 117/86 Fundal Height: 32 cm Fetal Heart Rate (bpm): 150 Total weight gain: 48 lb (21.8 kg)  General Appearance  No acute distress, well appearing, and well nourished Pulmonary   Normal work of breathing Neurologic   Alert and oriented to person, place, and time Psychiatric   Mood and affect within normal limits  Assessment/Plan   Plan  28 y.o. G2P1001 at [redacted]w[redacted]d presents for follow-up OB visit. Reviewed prenatal record including previous visit note.  Supervision of other normal pregnancy, antepartum Still needs to be scheduled for rC/D.  Having lots of hip and round ligament pain. Reviewed using KT for belly support in the summer and spending time in the pool for relief.  Reviewed kick counts and preterm labor warning signs. Instructed to call office or come to hospital with persistent headache, vision changes, regular contractions, leaking of fluid, decreased fetal movement or vaginal bleeding.      Future Appointments  Date Time Provider Department Center  08/11/2023  2:55 PM Sofia Dunn, MD [facility] None  08/26/2023 11:15 AM [facility] US  1 [facility]-IMG None    For next visit:  continue with routine prenatal care     Donato Fu, CNM [facility] 06/18/253:28 PM

## 2023-07-29 NOTE — Assessment & Plan Note (Signed)
 Still needs to be scheduled for rC/D.  Having lots of hip and round ligament pain. Reviewed using KT for belly support in the summer and spending time in the pool for relief.  Reviewed kick counts and preterm labor warning signs. Instructed to call office or come to hospital with persistent headache, vision changes, regular contractions, leaking of fluid, decreased fetal movement or vaginal bleeding.

## 2023-08-11 ENCOUNTER — Ambulatory Visit (INDEPENDENT_AMBULATORY_CARE_PROVIDER_SITE_OTHER): Admitting: Obstetrics

## 2023-08-11 ENCOUNTER — Encounter: Payer: Self-pay | Admitting: Obstetrics

## 2023-08-11 VITALS — BP 113/76 | HR 89 | Wt 161.0 lb

## 2023-08-11 DIAGNOSIS — Q513 Bicornate uterus: Secondary | ICD-10-CM | POA: Diagnosis not present

## 2023-08-11 DIAGNOSIS — Z98891 History of uterine scar from previous surgery: Secondary | ICD-10-CM

## 2023-08-11 DIAGNOSIS — O34219 Maternal care for unspecified type scar from previous cesarean delivery: Secondary | ICD-10-CM

## 2023-08-11 DIAGNOSIS — O3400 Maternal care for unspecified congenital malformation of uterus, unspecified trimester: Secondary | ICD-10-CM

## 2023-08-11 DIAGNOSIS — Z3A34 34 weeks gestation of pregnancy: Secondary | ICD-10-CM

## 2023-08-11 DIAGNOSIS — O3403 Maternal care for unspecified congenital malformation of uterus, third trimester: Secondary | ICD-10-CM

## 2023-08-11 NOTE — Progress Notes (Unsigned)
    Return Prenatal Note   Subjective  28 y.o. G2P1001 at [redacted]w[redacted]d presents for this follow-up prenatal visit. Pregnancy notable for bicornuate uterus, pregnancy in left horn, and prior CD x 1 desiring repeat.   Patient reports she is still having the ongoing hip pain and more pressure she feels like her hands and feet swell at night, reports her Zofran  is helping her nausea. Reiterates desire for 8/5 for her rCD.   Patient reports: Movement: Present Contractions: Not present Denies vaginal bleeding or leaking fluid. Objective  Flow sheet Vitals: Pulse Rate: 89 BP: 113/76 Fundal Height: 34 cm Fetal Heart Rate (bpm): 131 Total weight gain: 51 lb (23.1 kg)  General Appearance  No acute distress, well appearing, and well nourished Pulmonary   Normal work of breathing Neurologic   Alert and oriented to person, place, and time Psychiatric   Mood and affect within normal limits  Assessment/Plan   Plan  28 y.o. G2P1001 at [redacted]w[redacted]d by LMP=11wk US  presents for follow-up OB visit. Reviewed prenatal record including previous visit note.  1. Bicornuate uterus affecting pregnancy, antepartum (Primary) -Growth at [redacted]w[redacted]d US  appropriate, 46%ile; next 7/26 at 36wks -Recommend pregnancy belt, epsom salt soaks, massage, stretches for her hip pain -Can try wrist splints for hand swelling and compression stocking for feet/ankles  2. History of cesarean delivery -Scheduled w/L&D for 8/5; pt aware I will be out of office at that time. -Will coordinate with the other physicians and update her once scheduled; prefers female, but open to female; date of 8/5 is more important to her.  Return in 15 days (on 08/26/2023) for ROB.   Future Appointments  Date Time Provider Department Center  08/26/2023 11:15 AM [facility] US  1 [facility] None  08/26/2023  1:35 PM Janit Alm Agent, MD [facility] None   For next visit:  continue with routine prenatal care    Estil Mangle, DO  [facility]

## 2023-08-26 ENCOUNTER — Encounter: Payer: Self-pay | Admitting: Obstetrics and Gynecology

## 2023-08-26 ENCOUNTER — Ambulatory Visit: Admitting: Obstetrics and Gynecology

## 2023-08-26 ENCOUNTER — Ambulatory Visit

## 2023-08-26 ENCOUNTER — Other Ambulatory Visit (HOSPITAL_COMMUNITY)
Admission: RE | Admit: 2023-08-26 | Discharge: 2023-08-26 | Disposition: A | Discharge status: Home / Self Care | Source: Ambulatory Visit | Attending: Obstetrics and Gynecology | Admitting: Obstetrics and Gynecology

## 2023-08-26 VITALS — BP 105/70 | HR 94 | Wt 167.7 lb

## 2023-08-26 DIAGNOSIS — Q513 Bicornate uterus: Secondary | ICD-10-CM

## 2023-08-26 DIAGNOSIS — O3403 Maternal care for unspecified congenital malformation of uterus, third trimester: Secondary | ICD-10-CM

## 2023-08-26 DIAGNOSIS — O3400 Maternal care for unspecified congenital malformation of uterus, unspecified trimester: Secondary | ICD-10-CM

## 2023-08-26 DIAGNOSIS — Z113 Encounter for screening for infections with a predominantly sexual mode of transmission: Secondary | ICD-10-CM | POA: Insufficient documentation

## 2023-08-26 DIAGNOSIS — Z98891 History of uterine scar from previous surgery: Secondary | ICD-10-CM

## 2023-08-26 DIAGNOSIS — Z3480 Encounter for supervision of other normal pregnancy, unspecified trimester: Secondary | ICD-10-CM

## 2023-08-26 DIAGNOSIS — Z3A36 36 weeks gestation of pregnancy: Secondary | ICD-10-CM

## 2023-08-26 DIAGNOSIS — Z3685 Encounter for antenatal screening for Streptococcus B: Secondary | ICD-10-CM

## 2023-08-26 NOTE — Progress Notes (Signed)
 ROB. Patient states daily fetal movement and increased mucous like discharge. Growth ultrasound preformed today, efw 50%. GC/CT and GBS cultures ordered. Reports no longer wanting BTL now, only repeat cesarean on 09/15/23. Patient states no questions or concerns at this time.

## 2023-08-26 NOTE — Progress Notes (Signed)
 ROB:  EGA = 36.1.  Being followed for bicornuate uterus.  Reports no problems.  She is feeling daily movement.  Denies contractions.  Growth ultrasound today shows 50 percentile growth.  She is reaffirms her desire for repeat cesarean delivery.  She does not want a tubal ligation any longer.. Surgery scheduled for 8/5 at 10 AM.  Orders written.  Repeat cesarean delivery discussed.

## 2023-08-28 LAB — CERVICOVAGINAL ANCILLARY ONLY
Chlamydia: NEGATIVE
Comment: NEGATIVE
Comment: NORMAL
Neisseria Gonorrhea: NEGATIVE

## 2023-08-28 LAB — STREP GP B NAA: Strep Gp B NAA: NEGATIVE

## 2023-09-02 ENCOUNTER — Ambulatory Visit: Admitting: Certified Nurse Midwife

## 2023-09-02 ENCOUNTER — Encounter: Payer: Self-pay | Admitting: Certified Nurse Midwife

## 2023-09-02 VITALS — BP 98/62 | HR 91 | Wt 168.0 lb

## 2023-09-02 DIAGNOSIS — Z3480 Encounter for supervision of other normal pregnancy, unspecified trimester: Secondary | ICD-10-CM

## 2023-09-02 DIAGNOSIS — Z3483 Encounter for supervision of other normal pregnancy, third trimester: Secondary | ICD-10-CM

## 2023-09-02 DIAGNOSIS — Z3A37 37 weeks gestation of pregnancy: Secondary | ICD-10-CM | POA: Diagnosis not present

## 2023-09-02 NOTE — Assessment & Plan Note (Signed)
 Feeling tired but ready for baby.  Discussed fetal movement. She is feeling her baby move through the day but feels like lighter, less intense movements. Fetal movement noted during while listening to [facility] with doppler. Reviewed normal movement and what to do when concerned.  rC/S on 8/5. Reviewed to come to hospital before if there are signs of labor, water breaking or vaginal bleeding.

## 2023-09-02 NOTE — Progress Notes (Signed)
    Return Prenatal Note   Subjective   28 y.o. G2P1001 at [redacted]w[redacted]d presents for this follow-up prenatal visit.  Patient has been having some pain and pressure mostly at night. She has also been having swelling in her ankles, making it difficult for her to walk. Complains of insomnia and numbness in both arms while sleeping. Patient reports: Movement: (!) Decreased Contractions: Regular  Objective   Flow sheet Vitals: Pulse Rate: 91 BP: 98/62 Total weight gain: 58 lb (26.3 kg)  General Appearance  No acute distress, well appearing, and well nourished Pulmonary   Normal work of breathing Neurologic   Alert and oriented to person, place, and time Psychiatric   Mood and affect within normal limits  Assessment/Plan   Plan  28 y.o. G2P1001 at [redacted]w[redacted]d presents for follow-up OB visit. Reviewed prenatal record including previous visit note.  Supervision of other normal pregnancy, antepartum Feeling tired but ready for baby.  Discussed fetal movement. She is feeling her baby move through the day but feels like lighter, less intense movements. Fetal movement noted during while listening to [facility] with doppler. Reviewed normal movement and what to do when concerned.  rC/S on 8/5. Reviewed to come to hospital before if there are signs of labor, water breaking or vaginal bleeding.       No orders of the defined types were placed in this encounter.  No follow-ups on file.   Future Appointments  Date Time Provider Department Center  09/09/2023  1:55 PM Jayne Harlene CROME, CNM [facility] None  09/11/2023  8:00 AM [facility] PAT1 [facility] None  09/23/2023  3:15 PM Janit Alm Agent, MD [facility] None    For next visit:  continue with routine prenatal care     Damien Parsley, CNM [facility] 07/23/251:31 PM

## 2023-09-09 ENCOUNTER — Ambulatory Visit: Admitting: Certified Nurse Midwife

## 2023-09-09 ENCOUNTER — Encounter: Payer: Self-pay | Admitting: Certified Nurse Midwife

## 2023-09-09 VITALS — BP 115/76 | HR 74 | Wt 169.0 lb

## 2023-09-09 DIAGNOSIS — Z98891 History of uterine scar from previous surgery: Secondary | ICD-10-CM

## 2023-09-09 DIAGNOSIS — O368130 Decreased fetal movements, third trimester, not applicable or unspecified: Secondary | ICD-10-CM

## 2023-09-09 DIAGNOSIS — Z3480 Encounter for supervision of other normal pregnancy, unspecified trimester: Secondary | ICD-10-CM

## 2023-09-09 DIAGNOSIS — O3403 Maternal care for unspecified congenital malformation of uterus, third trimester: Secondary | ICD-10-CM

## 2023-09-09 DIAGNOSIS — Z3A38 38 weeks gestation of pregnancy: Secondary | ICD-10-CM

## 2023-09-09 DIAGNOSIS — Q513 Bicornate uterus: Secondary | ICD-10-CM | POA: Diagnosis not present

## 2023-09-09 NOTE — Patient Instructions (Addendum)
 Nonstress Test: What to Expect A nonstress test, also called an NST, is done during pregnancy to check your baby's heartbeat. The procedure can help to show if your baby is healthy. It may be done if: Your due date has passed. Your pregnancy is high risk. Your baby is moving less than normal. You've lost a previous pregnancy. Your baby is growing slowly. There's too much or too little fluid around your baby. The NST may be done in the third trimester to find out if it's best for your baby to be born early. During an NST, your baby's heartbeat is watched for at least 20 minutes. If the baby is healthy, the heart rate will go up when the baby moves and will return to normal when the baby rests. This should happen at least twice during the test. Tell a health care provider about: Any allergies you have. Any medical problems you have. All medicines you take. These include vitamins, herbs, eye drops, and creams. Any surgeries you've had. Any past pregnancies you've had. What are the risks? There are no risks to you or your baby from a nonstress test. This procedure shouldn't be painful or uncomfortable. What happens before? Eat a meal right before the test or as told by your health care team. Food may help the baby to move. Use the restroom right before the test. What happens during a nonstress test?  Two monitors will be placed on your belly. One will check your baby's heart rate, and the other will check for contractions. You may be asked to lie down on your side or to sit up. You may be given a button to press when you feel your baby move. If your baby seems to be sleeping, you may be asked to drink some juice or soda, eat a snack, or change positions. These steps may vary. Ask what you can expect. What can I expect after? Your team will talk with you about the results and tell you the next steps. If your team gave you any diet or activity instructions, make sure to follow them. Keep all  follow-up visits. This is important to check on your health and the health of your baby. This information is not intended to replace advice given to you by your health care provider. Make sure you discuss any questions you have with your health care provider. Document Revised: 01/22/2023 Document Reviewed: 01/22/2023 Elsevier Patient Education  2025 [employer]. Third Trimester of Pregnancy  The third trimester of pregnancy is from week 28 through week 40. This is months 7 through 9. The third trimester is a time when your baby is growing fast. Body changes during your third trimester Your body continues to change during this time. The changes usually go away after your baby is born. Physical changes You will continue to gain weight. You may get stretch marks on your hips, belly, and breasts. Your breasts will keep growing and may hurt. A yellow fluid (colostrum) may leak from your breasts. This is the first milk you're making for your baby. Your hair may grow faster and get thicker. In some cases, you may get hair loss. Your belly button may stick out. You may have more swelling in your hands, face, or ankles. Health changes You may have heartburn. You may feel short of breath. This is caused by the uterus that is now bigger. You may have more aches in the pelvis, back, or thighs. You may have more tingling or numbness in your hands,  arms, and legs. You may pee more often. You may have trouble pooping (constipation) or swollen veins in the butt that can itch or get painful (hemorrhoids). Other changes You may have more problems sleeping. You may notice the baby moving lower in your belly (dropping). You may have more fluid coming from your vagina. Your joints may feel loose, and you may have pain around your pelvic bone. Follow these instructions at home: Medicines Take medicines only as told by your health care provider. Some medicines are not safe during pregnancy. Your provider  may change the medicines that you take. Do not take any medicines unless told to by your provider. Take a prenatal vitamin that has at least 600 micrograms (mcg) of folic acid. Do not use herbal medicines, illegal drugs, or medicines that are not approved by your provider. Eating and drinking While you're pregnant your body needs additional nutrition to help support your growing baby. Talk with your provider about your nutritional needs. Activity Most women are able to exercise regularly during pregnancy. Exercise routines may need to change at the end of your pregnancy. Talk to your provider about your activities and exercise routine. Relieving pain and discomfort Rest often with your legs raised if you have leg cramps or low back pain. Take warm sitz baths to soothe pain from hemorrhoids. Use hemorrhoid cream if your provider says it's okay. Wear a good, supportive bra if your breasts hurt. Do not use hot tubs, steam rooms, or saunas. Do not douche. Do not use tampons or scented pads. Safety Talk to your provider before traveling far distances. Wear your seatbelt at all times when you're in a car. Talk to your provider if someone hits you, hurts you, or yells at you. Preparing for birth To prepare for your baby: Take childbirth and breastfeeding classes. Visit the hospital and tour the maternity area. Buy a rear-facing car seat. Learn how to install it in your car. General instructions Avoid cat litter boxes and soil used by cats. These things carry germs that can cause harm to your pregnancy and your baby. Do not drink alcohol, smoke, vape, or use products with nicotine  or tobacco in them. If you need help quitting, talk with your provider. Keep all follow-up visits for your third trimester. Your provider will do more exams and tests during this trimester. Write down your questions. Take them to your prenatal visits. Your provider also will: Talk with you about your overall  health. Give you advice or refer you to specialists who can help with different needs, including: Mental health and counseling. Foods and healthy eating. Ask for help if you need help with food. Where to find more information American Pregnancy Association: americanpregnancy.org [employer] of Obstetricians and Gynecologists: acog.org Office on [employer] Health: [employer] Contact a health care provider if: You have a headache that does not go away when you take medicine. You have any of these problems: You can't eat or drink. You have nausea and vomiting. You have watery poop (diarrhea) for 2 days or more. You have pain when you pee, or your pee smells bad. You have been sick for 2 days or more and aren't getting better. Contact your provider right away if: You have any of these coming from your vagina: Abnormal discharge. Bad-smelling fluid. Bleeding. Your baby is moving less than usual. You have signs of labor: You have any contractions, belly cramping, or have pain in your pelvis or lower back before 37 weeks of pregnancy (preterm labor). You  have regular contractions that are less than 5 minutes apart. Your water breaks. You have symptoms of high blood pressure or preeclampsia. These include: A severe, throbbing headache that does not go away. Sudden or extreme swelling of your face, hands, legs, or feet. Vision problems: You see spots. You have blurry vision. Your eyes are sensitive to light. If you can't reach your provider, go to an urgent care or emergency room. Get help right away if: You faint, become confused, or can't think clearly. You have chest pain or trouble breathing. You have any kind of injury, such as from a fall or a car crash. These symptoms may be an emergency. Call 911 right away. Do not wait to see if the symptoms will go away. Do not drive yourself to the hospital. This information is not intended to replace advice given to you by your  health care provider. Make sure you discuss any questions you have with your health care provider. Document Revised: 10/30/2022 Document Reviewed: 05/30/2022 Elsevier Patient Education  2024 [employer].

## 2023-09-09 NOTE — Assessment & Plan Note (Signed)
 Reviewed labor warning signs and expectations for birth. Instructed to call office or come to hospital with persistent headache, vision changes, regular contractions, leaking of fluid, decreased fetal movement or vaginal bleeding. Anticipatory guidance for scheduled cesarean reviewed.

## 2023-09-09 NOTE — Progress Notes (Signed)
    Return Prenatal Note   Subjective   28 y.o. G2P1001 at [redacted]w[redacted]d presents for this follow-up prenatal visit.  Patient reports feeling baby move but less frequently the last few days. Would like cervix checked, lost mucous plug yesterday. Patient reports: Movement: Present Contractions: Irritability  Objective   Flow sheet Vitals: Pulse Rate: 74 BP: 115/76 Fundal Height: 37 cm Fetal Heart Rate (bpm): 125 Presentation: Vertex Dilation: 1 Effacement (%): 50 Station: -1 Total weight gain: 59 lb (26.8 kg)  Stacey Donovan 1995/10/11 [redacted]w[redacted]d  Fetus A Non-Stress Test Interpretation for 09/09/23  Indication: Decreased Fetal Movement  Fetal Heart Rate A Mode: External Baseline Rate (A): 130 bpm Variability: Moderate Accelerations: 15 x 15 Decelerations: None Scalp Stimulation: Negative Multiple birth?: No  Uterine Activity Mode: Toco Contraction Frequency (min): None  Interpretation (Fetal Testing) Nonstress Test Interpretation: Reactive Overall Impression: Reassuring for gestational age   General Appearance  No acute distress, well appearing, and well nourished Pulmonary   Normal work of breathing Neurologic   Alert and oriented to person, place, and time Psychiatric   Mood and affect within normal limits  Assessment/Plan   Plan  28 y.o. G2P1001 at [redacted]w[redacted]d presents for follow-up OB visit. Reviewed prenatal record including previous visit note.  Supervision of other normal pregnancy, antepartum Reviewed labor warning signs and expectations for birth. Instructed to call office or come to hospital with persistent headache, vision changes, regular contractions, leaking of fluid, decreased fetal movement or vaginal bleeding. Anticipatory guidance for scheduled cesarean reviewed.      No orders of the defined types were placed in this encounter.  Return in 6 days (on 09/15/2023) for Scheduled Cesarean.   Future Appointments  Date Time Provider Department  Center  09/11/2023  8:00 AM [facility] None  09/23/2023  3:15 PM Stacey Alm Agent, MD [facility] None    For next visit:  Cesarean scheduled 8/5     Stacey Donovan, CNM  07/30/253:02 PM

## 2023-09-11 ENCOUNTER — Other Ambulatory Visit: Payer: Self-pay

## 2023-09-11 ENCOUNTER — Encounter
Admission: RE | Admit: 2023-09-11 | Discharge: 2023-09-11 | Disposition: A | Discharge status: Home / Self Care | Source: Ambulatory Visit | Attending: Obstetrics and Gynecology | Admitting: Obstetrics and Gynecology

## 2023-09-11 HISTORY — DX: Bicornate uterus: Q51.3

## 2023-09-11 HISTORY — DX: Gastro-esophageal reflux disease without esophagitis: K21.9

## 2023-09-11 NOTE — Patient Instructions (Addendum)
 Your procedure is scheduled on:09-16-23 Tuesday  Arrival Time: 7:45 AM. Please call Labor and Delivery if you have any questions. [phone].  Arrival: If your arrival time is prior to 6:00 am, please enter through the Emergency Room Entrance and you will be directed to Labor and Delivery. If your arrival time is 6:00 am or later, please enter the Medical Mall and follow the greeter's instructions.  REMEMBER: Instructions that are not followed completely may result in serious medical risk, up to and including death; or upon the discretion of your surgeon and anesthesiologist your surgery may need to be rescheduled.  Do not eat food OR drink liquids after midnight the night before surgery.  No gum chewing or hard candies.  One week prior to surgery:Stop NOW (09-11-23) Stop Anti-inflammatories (NSAIDS) such as Advil , Aleve, Ibuprofen , Motrin , Naproxen, Naprosyn and Aspirin based products such as Excedrin, Goody's Powder, BC Powder  You may however, continue to take Tylenol  if needed for pain up until the day of surgery.  Do NOT take any medication the day of surgery  No Alcohol for 24 hours before or after surgery.  No Smoking including e-cigarettes for 24 hours prior to surgery.  No chewable tobacco products for at least 6 hours prior to surgery.  No nicotine  patches on the day of surgery.  Do not use any recreational drugs for at least a week prior to your surgery.  Please be advised that the combination of cocaine and anesthesia may have negative outcomes, up to and including death. If you test positive for cocaine, your surgery will be cancelled.  On the morning of surgery brush your teeth with toothpaste and water, you may rinse your mouth with mouthwash if you wish. Do not swallow any toothpaste or mouthwash.  Use CHG soap as directed on instruction sheet (Avoid nipple and private areas)  Do not wear jewelry, make-up, hairpins, clips or nail polish.  For welded (permanent)  jewelry: bracelets, anklets, waist bands, etc.  Please have this removed prior to surgery.  If it is not removed, there is a chance that hospital personnel will need to cut it off on the day of surgery.  Do not wear lotions, powders, or perfumes.   Do not shave body hair from the neck down 48 hours before surgery.  Contact lenses, hearing aids and dentures may not be worn into surgery.  Do not bring valuables to the hospital. [facility] is not responsible for any missing/lost belongings or valuables.   Notify your doctor if there is any change in your medical condition (cold, fever, infection).  Wear comfortable clothing (specific to your surgery type) to the hospital.  After surgery, you can help prevent lung complications by doing breathing exercises.  Take deep breaths and cough every 1-2 hours. Your doctor may order a device called an Incentive Spirometer to help you take deep breaths. When coughing or sneezing, hold a pillow firmly against your incision with both hands. This is called "splinting." Doing this helps protect your incision. It also decreases belly discomfort.  Please call the Pre-admissions Testing Dept. at [phone] if you have any questions about these instructions.  Surgery Visitation Policy:  Visitor Passes   All visitors, including children, need an identification sticker when visiting. These stickers must be worn where they can be seen.   Labor & Delivery  Laboring women may have one designated support person and two other visitors of any age visit. The support person must remain the same. The visitors may  switch with other visitors. Visitation is permitted 24 hours per day. The designated support person or a visitor over the age of 16 may sleep overnight in the patient's room. A doula registered with [facility] for labor and delivery support is not considered a visitor. Doulas not registered with [facility] are considered visitors.  Mother Baby  Unit, OB Specialty and Gynecological Care  A designated support person and three visitors of any age may visit. The three visitors may switch out. The designated support person or a visitor age 69 or older may stay overnight in the room. During the postpartum period (up to 6 weeks), if the mother is the patient, she can have her newborn stay with her if there is another support person present who can be responsible for the baby.      Preparing for Surgery with CHLORHEXIDINE  GLUCONATE (CHG) Soap  Chlorhexidine  Gluconate (CHG) Soap  o An antiseptic cleaner that kills germs and bonds with the skin to continue killing germs even after washing  o Used for showering the night before surgery and morning of surgery  Before surgery, you can play an important role by reducing the number of germs on your skin.  CHG (Chlorhexidine  gluconate) soap is an antiseptic cleanser which kills germs and bonds with the skin to continue killing germs even after washing.  Please do not use if you have an allergy to CHG or antibacterial soaps. If your skin becomes reddened/irritated stop using the CHG.  1. Shower the NIGHT BEFORE SURGERY and the MORNING OF SURGERY with CHG soap.  2. If you choose to wash your hair, wash your hair first as usual with your normal shampoo.  3. After shampooing, rinse your hair and body thoroughly to remove the shampoo.  4. Use CHG as you would any other liquid soap. You can apply CHG directly to the skin and wash gently with a scrungie or a clean washcloth.  5. Apply the CHG soap to your body only from the neck down. Do not use on open wounds or open sores. Avoid contact with your eyes, ears, mouth, and genitals (private parts). Wash face and genitals (private parts) with your normal soap.  6. Wash thoroughly, paying special attention to the area where your surgery will be performed.  7. Thoroughly rinse your body with warm water.  8. Do not shower/wash with your normal soap  after using and rinsing off the CHG soap.  9. Pat yourself dry with a clean towel.  10. Wear clean pajamas to bed the night before surgery.  12. Place clean sheets on your bed the night of your first shower and do not sleep with pets.  13. Shower again with the CHG soap on the day of surgery prior to arriving at the hospital.  14. Do not apply any deodorants/lotions/powders.  15. Please wear clean clothes to the hospital.

## 2023-09-14 ENCOUNTER — Encounter
Admission: RE | Admit: 2023-09-14 | Discharge: 2023-09-14 | Disposition: A | Discharge status: Home / Self Care | Source: Ambulatory Visit | Attending: Obstetrics and Gynecology | Admitting: Obstetrics and Gynecology

## 2023-09-14 DIAGNOSIS — Z3A36 36 weeks gestation of pregnancy: Secondary | ICD-10-CM | POA: Insufficient documentation

## 2023-09-14 DIAGNOSIS — Z01812 Encounter for preprocedural laboratory examination: Secondary | ICD-10-CM | POA: Insufficient documentation

## 2023-09-14 LAB — CBC
HCT: 31.3 % — ABNORMAL LOW (ref 36.0–46.0)
Hemoglobin: 10.1 g/dL — ABNORMAL LOW (ref 12.0–15.0)
MCH: 25 pg — ABNORMAL LOW (ref 26.0–34.0)
MCHC: 32.3 g/dL (ref 30.0–36.0)
MCV: 77.5 fL — ABNORMAL LOW (ref 80.0–100.0)
Platelets: 288 K/uL (ref 150–400)
RBC: 4.04 MIL/uL (ref 3.87–5.11)
RDW: 13.6 % (ref 11.5–15.5)
WBC: 6.5 K/uL (ref 4.0–10.5)
nRBC: 0 % (ref 0.0–0.2)

## 2023-09-14 LAB — TYPE AND SCREEN
ABO/RH(D): A POS
Antibody Screen: NEGATIVE
Extend sample reason: UNDETERMINED

## 2023-09-15 ENCOUNTER — Other Ambulatory Visit: Payer: Self-pay

## 2023-09-15 ENCOUNTER — Inpatient Hospital Stay: Payer: Self-pay | Admitting: Urgent Care

## 2023-09-15 ENCOUNTER — Encounter
Admission: RE | Disposition: A | Discharge status: Home / Self Care | Payer: Self-pay | Source: Ambulatory Visit | Attending: Obstetrics and Gynecology

## 2023-09-15 ENCOUNTER — Encounter: Payer: Self-pay | Admitting: Obstetrics and Gynecology

## 2023-09-15 ENCOUNTER — Inpatient Hospital Stay
Admission: RE | Admit: 2023-09-15 | Discharge: 2023-09-17 | DRG: 788 | Disposition: A | Discharge status: Home / Self Care | Source: Ambulatory Visit | Attending: Obstetrics and Gynecology | Admitting: Obstetrics and Gynecology

## 2023-09-15 ENCOUNTER — Inpatient Hospital Stay

## 2023-09-15 DIAGNOSIS — O3403 Maternal care for unspecified congenital malformation of uterus, third trimester: Secondary | ICD-10-CM | POA: Diagnosis present

## 2023-09-15 DIAGNOSIS — Z3A39 39 weeks gestation of pregnancy: Secondary | ICD-10-CM

## 2023-09-15 DIAGNOSIS — O3400 Maternal care for unspecified congenital malformation of uterus, unspecified trimester: Principal | ICD-10-CM

## 2023-09-15 DIAGNOSIS — O34593 Maternal care for other abnormalities of gravid uterus, third trimester: Secondary | ICD-10-CM | POA: Diagnosis not present

## 2023-09-15 DIAGNOSIS — Z3490 Encounter for supervision of normal pregnancy, unspecified, unspecified trimester: Secondary | ICD-10-CM

## 2023-09-15 DIAGNOSIS — Q513 Bicornate uterus: Secondary | ICD-10-CM

## 2023-09-15 DIAGNOSIS — O34211 Maternal care for low transverse scar from previous cesarean delivery: Secondary | ICD-10-CM | POA: Diagnosis present

## 2023-09-15 LAB — RPR: RPR Ser Ql: NONREACTIVE

## 2023-09-15 SURGERY — Surgical Case
Anesthesia: Spinal

## 2023-09-15 MED ORDER — SOD CITRATE-CITRIC ACID 500-334 MG/5ML PO SOLN
ORAL | Status: AC
  Administered 2023-09-15: 30 mL
  Filled 2023-09-15: qty 15

## 2023-09-15 MED ORDER — POVIDONE-IODINE 10 % EX SWAB
2.0000 | Freq: Once | CUTANEOUS | Status: AC
  Administered 2023-09-15: 2 via TOPICAL

## 2023-09-15 MED ORDER — LACTATED RINGERS IV BOLUS
1000.0000 mL | Freq: Once | INTRAVENOUS | Status: AC
  Administered 2023-09-15: 1000 mL via INTRAVENOUS

## 2023-09-15 MED ORDER — ONDANSETRON HCL 4 MG/2ML IJ SOLN
4.0000 mg | Freq: Three times a day (TID) | INTRAMUSCULAR | Status: DC | PRN
  Administered 2023-09-15 – 2023-09-17 (×3): 4 mg via INTRAVENOUS
  Filled 2023-09-15 (×3): qty 2

## 2023-09-15 MED ORDER — PRENATAL MULTIVITAMIN CH
1.0000 | ORAL_TABLET | Freq: Every day | ORAL | Status: DC
  Administered 2023-09-15 – 2023-09-17 (×3): 1 via ORAL
  Filled 2023-09-15 (×3): qty 1

## 2023-09-15 MED ORDER — EPHEDRINE SULFATE-NACL 50-0.9 MG/10ML-% IV SOSY
PREFILLED_SYRINGE | INTRAVENOUS | Status: DC | PRN
  Administered 2023-09-15: 5 mg via INTRAVENOUS

## 2023-09-15 MED ORDER — MORPHINE SULFATE (PF) 0.5 MG/ML IJ SOLN
INTRAMUSCULAR | Status: DC | PRN
  Administered 2023-09-15: 150 ug via EPIDURAL

## 2023-09-15 MED ORDER — DIPHENHYDRAMINE HCL 50 MG/ML IJ SOLN
12.5000 mg | INTRAMUSCULAR | Status: DC | PRN
  Administered 2023-09-15 (×2): 12.5 mg via INTRAVENOUS
  Filled 2023-09-15: qty 1

## 2023-09-15 MED ORDER — ONDANSETRON HCL 4 MG/2ML IJ SOLN
INTRAMUSCULAR | Status: DC | PRN
  Administered 2023-09-15: 4 mg via INTRAVENOUS

## 2023-09-15 MED ORDER — ACETAMINOPHEN 500 MG PO TABS
1000.0000 mg | ORAL_TABLET | Freq: Four times a day (QID) | ORAL | Status: AC
  Administered 2023-09-15 – 2023-09-16 (×4): 1000 mg via ORAL
  Filled 2023-09-15 (×4): qty 2

## 2023-09-15 MED ORDER — FENTANYL CITRATE (PF) 100 MCG/2ML IJ SOLN
INTRAMUSCULAR | Status: DC | PRN
  Administered 2023-09-15: 20 ug via INTRAVENOUS
  Administered 2023-09-15: 80 ug via INTRAVENOUS

## 2023-09-15 MED ORDER — CEFAZOLIN SODIUM-DEXTROSE 2-4 GM/100ML-% IV SOLN
2.0000 g | INTRAVENOUS | Status: AC
  Administered 2023-09-15: 2 g via INTRAVENOUS
  Filled 2023-09-15: qty 100

## 2023-09-15 MED ORDER — MORPHINE SULFATE (PF) 0.5 MG/ML IJ SOLN
INTRAMUSCULAR | Status: AC
  Filled 2023-09-15: qty 10

## 2023-09-15 MED ORDER — PHENYLEPHRINE 80 MCG/ML (10ML) SYRINGE FOR IV PUSH (FOR BLOOD PRESSURE SUPPORT)
PREFILLED_SYRINGE | INTRAVENOUS | Status: DC | PRN
  Administered 2023-09-15: 80 ug via INTRAVENOUS

## 2023-09-15 MED ORDER — CHLORHEXIDINE GLUCONATE 0.12 % MT SOLN
15.0000 mL | Freq: Once | OROMUCOSAL | Status: AC
  Administered 2023-09-15: 15 mL via OROMUCOSAL
  Filled 2023-09-15: qty 15

## 2023-09-15 MED ORDER — LIDOCAINE 5 % EX PTCH
MEDICATED_PATCH | CUTANEOUS | Status: DC | PRN
  Administered 2023-09-15: 1 via TRANSDERMAL

## 2023-09-15 MED ORDER — IBUPROFEN 600 MG PO TABS
600.0000 mg | ORAL_TABLET | Freq: Four times a day (QID) | ORAL | Status: DC
  Administered 2023-09-15 – 2023-09-17 (×8): 600 mg via ORAL
  Filled 2023-09-15 (×8): qty 1

## 2023-09-15 MED ORDER — KETOROLAC TROMETHAMINE 30 MG/ML IJ SOLN
30.0000 mg | Freq: Four times a day (QID) | INTRAMUSCULAR | Status: AC | PRN
Start: 2023-09-15 — End: 2023-09-16

## 2023-09-15 MED ORDER — DIPHENHYDRAMINE HCL 50 MG/ML IJ SOLN
INTRAMUSCULAR | Status: AC
Start: 2023-09-15 — End: 2023-09-15
  Filled 2023-09-15: qty 1

## 2023-09-15 MED ORDER — LACTATED RINGERS IV SOLN: INTRAVENOUS | Status: DC

## 2023-09-15 MED ORDER — KETOROLAC TROMETHAMINE 30 MG/ML IJ SOLN
INTRAMUSCULAR | Status: DC | PRN
  Administered 2023-09-15: 30 mg via INTRAVENOUS

## 2023-09-15 MED ORDER — OXYCODONE HCL 5 MG PO TABS
5.0000 mg | ORAL_TABLET | Freq: Four times a day (QID) | ORAL | Status: DC | PRN
  Administered 2023-09-16 (×2): 5 mg via ORAL
  Filled 2023-09-15 (×2): qty 1

## 2023-09-15 MED ORDER — ORAL CARE MOUTH RINSE: 15.0000 mL | Freq: Once | OROMUCOSAL | Status: AC

## 2023-09-15 MED ORDER — BUPIVACAINE IN DEXTROSE 0.75-8.25 % IT SOLN
INTRATHECAL | Status: DC | PRN
Start: 2023-09-15 — End: 2023-09-15
  Administered 2023-09-15: 1.5 mL via INTRATHECAL

## 2023-09-15 MED ORDER — KETOROLAC TROMETHAMINE 30 MG/ML IJ SOLN
INTRAMUSCULAR | Status: AC
Start: 2023-09-15 — End: 2023-09-15
  Filled 2023-09-15: qty 1

## 2023-09-15 MED ORDER — SENNOSIDES-DOCUSATE SODIUM 8.6-50 MG PO TABS
2.0000 | ORAL_TABLET | ORAL | Status: DC
  Administered 2023-09-15 – 2023-09-16 (×2): 2 via ORAL
  Filled 2023-09-15 (×2): qty 2

## 2023-09-15 MED ORDER — NALOXONE HCL 0.4 MG/ML IJ SOLN
0.4000 mg | INTRAMUSCULAR | Status: DC | PRN
  Filled 2023-09-15: qty 1

## 2023-09-15 MED ORDER — OXYTOCIN-SODIUM CHLORIDE 30-0.9 UT/500ML-% IV SOLN
2.5000 [IU]/h | INTRAVENOUS | Status: AC
  Administered 2023-09-15: 2.5 [IU]/h via INTRAVENOUS

## 2023-09-15 MED ORDER — NALOXONE HCL 4 MG/10ML IJ SOLN
1.0000 ug/kg/h | INTRAVENOUS | Status: DC | PRN
  Filled 2023-09-15: qty 5

## 2023-09-15 MED ORDER — OXYTOCIN-SODIUM CHLORIDE 30-0.9 UT/500ML-% IV SOLN
INTRAVENOUS | Status: DC | PRN
  Administered 2023-09-15: 30 [IU] via INTRAVENOUS

## 2023-09-15 MED ORDER — MEPERIDINE HCL 25 MG/ML IJ SOLN: 6.2500 mg | INTRAMUSCULAR | Status: DC | PRN

## 2023-09-15 MED ORDER — PHENYLEPHRINE 80 MCG/ML (10ML) SYRINGE FOR IV PUSH (FOR BLOOD PRESSURE SUPPORT)
PREFILLED_SYRINGE | INTRAVENOUS | Status: AC
  Filled 2023-09-15: qty 10

## 2023-09-15 MED ORDER — MENTHOL 3 MG MT LOZG: 1.0000 | LOZENGE | OROMUCOSAL | Status: DC | PRN

## 2023-09-15 MED ORDER — LIDOCAINE 5 % EX PTCH
MEDICATED_PATCH | CUTANEOUS | Status: AC
  Filled 2023-09-15: qty 1

## 2023-09-15 MED ORDER — DIPHENHYDRAMINE HCL 25 MG PO CAPS
25.0000 mg | ORAL_CAPSULE | ORAL | Status: DC | PRN
  Filled 2023-09-15: qty 1

## 2023-09-15 MED ORDER — OXYTOCIN-SODIUM CHLORIDE 30-0.9 UT/500ML-% IV SOLN
INTRAVENOUS | Status: AC
  Filled 2023-09-15: qty 500

## 2023-09-15 MED ORDER — PHENYLEPHRINE HCL-NACL 20-0.9 MG/250ML-% IV SOLN
INTRAVENOUS | Status: AC
  Filled 2023-09-15: qty 250

## 2023-09-15 MED ORDER — LACTATED RINGERS IV SOLN: Freq: Once | INTRAVENOUS | Status: DC

## 2023-09-15 MED ORDER — SODIUM CHLORIDE 0.9% FLUSH: 3.0000 mL | INTRAVENOUS | Status: DC | PRN

## 2023-09-15 MED ORDER — KETOROLAC TROMETHAMINE 30 MG/ML IJ SOLN: 30.0000 mg | Freq: Four times a day (QID) | INTRAMUSCULAR | Status: AC | PRN

## 2023-09-15 MED ORDER — DIPHENHYDRAMINE HCL 25 MG PO CAPS
25.0000 mg | ORAL_CAPSULE | Freq: Four times a day (QID) | ORAL | Status: DC | PRN
  Administered 2023-09-15: 25 mg via ORAL
  Filled 2023-09-15: qty 1

## 2023-09-15 MED ORDER — SIMETHICONE 80 MG PO CHEW
80.0000 mg | CHEWABLE_TABLET | Freq: Four times a day (QID) | ORAL | Status: DC
  Administered 2023-09-15 – 2023-09-17 (×7): 80 mg via ORAL
  Filled 2023-09-15 (×7): qty 1

## 2023-09-15 MED ORDER — PHENYLEPHRINE HCL-NACL 20-0.9 MG/250ML-% IV SOLN
INTRAVENOUS | Status: DC | PRN
  Administered 2023-09-15: 60 ug/min via INTRAVENOUS

## 2023-09-15 MED ORDER — OXYCODONE-ACETAMINOPHEN 5-325 MG PO TABS
1.0000 | ORAL_TABLET | ORAL | Status: DC | PRN
  Administered 2023-09-16: 1 via ORAL
  Administered 2023-09-16 – 2023-09-17 (×2): 2 via ORAL
  Filled 2023-09-15: qty 1
  Filled 2023-09-15 (×2): qty 2

## 2023-09-15 MED ORDER — ZOLPIDEM TARTRATE 5 MG PO TABS: 5.0000 mg | ORAL_TABLET | Freq: Every evening | ORAL | Status: DC | PRN

## 2023-09-15 MED ORDER — FENTANYL CITRATE (PF) 100 MCG/2ML IJ SOLN
INTRAMUSCULAR | Status: AC
  Filled 2023-09-15: qty 2

## 2023-09-15 MED ORDER — SCOPOLAMINE 1 MG/3DAYS TD PT72
1.0000 | MEDICATED_PATCH | Freq: Once | TRANSDERMAL | Status: DC
  Filled 2023-09-15: qty 1

## 2023-09-15 SURGICAL SUPPLY — 26 items
ADHESIVE MASTISOL STRL (MISCELLANEOUS) ×1 IMPLANT
BAG COUNTER SPONGE SURGICOUNT (BAG) ×1 IMPLANT
BENZOIN TINCTURE PRP APPL 2/3 (GAUZE/BANDAGES/DRESSINGS) IMPLANT
CHLORAPREP W/TINT 26 (MISCELLANEOUS) ×2 IMPLANT
DRESSING TELFA 8X10 (GAUZE/BANDAGES/DRESSINGS) IMPLANT
DRSG TELFA 3X8 NADH STRL (GAUZE/BANDAGES/DRESSINGS) ×1 IMPLANT
GAUZE SPONGE 4X4 12PLY STRL (GAUZE/BANDAGES/DRESSINGS) ×1 IMPLANT
GLOVE PI ORTHO PRO STRL 7.5 (GLOVE) ×1 IMPLANT
GOWN STRL REUS W/ TWL LRG LVL3 (GOWN DISPOSABLE) ×2 IMPLANT
KIT TURNOVER KIT A (KITS) ×1 IMPLANT
MANIFOLD NEPTUNE II (INSTRUMENTS) ×1 IMPLANT
MAT PREVALON FULL STRYKER (MISCELLANEOUS) ×1 IMPLANT
NS IRRIG 1000ML POUR BTL (IV SOLUTION) ×1 IMPLANT
PACK C SECTION AR (MISCELLANEOUS) ×1 IMPLANT
PAD OB MATERNITY 11 LF (PERSONAL CARE ITEMS) ×1 IMPLANT
PAD PREP OB/GYN DISP 24X41 (PERSONAL CARE ITEMS) ×1 IMPLANT
RETRACTOR TRAXI PANNICULUS (MISCELLANEOUS) IMPLANT
RETRACTOR WND ALEXIS-O 25 LRG (MISCELLANEOUS) ×1 IMPLANT
SCRUB CHG 4% DYNA-HEX 4OZ (MISCELLANEOUS) ×1 IMPLANT
SPONGE T-LAP 18X18 ~~LOC~~+RFID (SPONGE) ×1 IMPLANT
SUT VIC AB 0 CTX36XBRD ANBCTRL (SUTURE) ×2 IMPLANT
SUT VIC AB 1 CT1 36 (SUTURE) ×2 IMPLANT
SUT VICRYL+ 3-0 36IN CT-1 (SUTURE) ×2 IMPLANT
TAPE SURG TRANSPORE 1 IN (GAUZE/BANDAGES/DRESSINGS) IMPLANT
TRAP FLUID SMOKE EVACUATOR (MISCELLANEOUS) ×1 IMPLANT
WATER STERILE IRR 500ML POUR (IV SOLUTION) ×1 IMPLANT

## 2023-09-15 NOTE — Anesthesia Procedure Notes (Signed)
 Spinal  Patient location during procedure: OR Start time: 09/15/2023 10:03 AM End time: 09/15/2023 10:07 AM Staffing Performed: resident/CRNA  Resident/CRNA: Brien Sotero PARAS, CRNA Performed by: Brien Sotero PARAS, CRNA Authorized by: Vicci Camellia Glatter, MD   Preanesthetic Checklist Completed: patient identified, IV checked, site marked, risks and benefits discussed, surgical consent, monitors and equipment checked, pre-op evaluation and timeout performed Spinal Block Patient position: sitting Prep: ChloraPrep Patient monitoring: heart rate, continuous pulse ox and blood pressure Approach: midline Location: L3-4 Injection technique: single-shot Needle Needle type: Pencan  Needle gauge: 25 G Assessment Events: CSF return

## 2023-09-15 NOTE — Discharge Summary (Signed)
 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 yo G2P1001 at 105w0d was admitted to the hospital 09/15/2023 for scheduled cesarean section with the following indication:Elective Repeat.Delivery details are as follows:  Membrane Rupture Time/Date: 10:36 AM,09/15/2023  Delivery Method:C-Section, Low Transverse Operative Delivery:N/A Details of operation can be found in separate operative note.  Patient had a postpartum course complicated by***.  She is ambulating, tolerating a regular diet, passing flatus, and urinating well. Patient is discharged home in stable condition on  09/15/23        Newborn Data: Birth date:09/15/2023 Birth time:10:37 AM Gender:Female Living status:Living Apgars:8 ,9  Weight:7 lb 10.8 oz (3.48 kg)    Magnesium Sulfate received: No BMZ received: No Rhophylac:N/A MMR:N/A T-DaP:declined Flu: N/A RSV Vaccine received: No Transfusion:{Transfusion received:30440034} Immunizations administered: Immunization History  Administered Date(s) Administered   Influenza,inj,Quad PF,6+ Mos 12/01/2017   Tdap 02/22/2018    Physical exam  Vitals:   09/15/23 2000 09/15/23 2010 09/15/23 2100 09/15/23 2130  BP:      Pulse: 78 72 71 78  Resp:      Temp:       TempSrc:      SpO2: 97% 97% 97% 93%  Weight:      Height:       General: {Exam; general:21111117} Lochia: {Desc; appropriate/inappropriate:30686::appropriate} Uterine Fundus: {Desc; firm/soft:30687} Incision: {Exam; incision:21111123} DVT Evaluation: {Exam; dvt:2111122} Labs: Lab Results  Component Value Date   WBC 6.5 09/14/2023   HGB 10.1 (L) 09/14/2023   HCT 31.3 (L) 09/14/2023   MCV 77.5 (L) 09/14/2023   PLT 288 09/14/2023      Latest Ref Rng & Units 08/21/2017   10:50 AM  CMP  Glucose 70 - 99 mg/dL 89   BUN 6 - 20 mg/dL 7   Creatinine 9.55 - 8.99 mg/dL 9.32   Sodium 864 - 854 mmol/L 134   Potassium 3.5 - 5.1 mmol/L 3.4   Chloride 98 - 111 mmol/L 105   CO2 22 - 32 mmol/L 18   Calcium 8.9 - 10.3 mg/dL 9.0    Edinburgh Score:    03/25/2018   10:57 AM  Edinburgh Postnatal Depression Scale Screening Tool  I have been able to laugh and see the funny side of things. 0  I have looked forward with enjoyment to things. 0  I have blamed myself unnecessarily when things went wrong. 0  I have been anxious or worried for no good reason. 0  I have felt scared or panicky for no good reason. 0  Things have been getting on top of me. 1  I have been so unhappy that I have had difficulty sleeping. 0  I have felt sad  or miserable. 0  I have been so unhappy that I have been crying. 0  The thought of harming myself has occurred to me. 0  Edinburgh Postnatal Depression Scale Total 1      Data saved with a previous flowsheet row definition      After visit meds:  Allergies as of 09/15/2023   No Known Allergies   Med Rec must be completed prior to using this [facility]***        Discharge home in stable condition Infant Feeding: {Baby feeding:23562} Infant Disposition:{CHL IP OB HOME WITH FNUYZM:76418} Discharge instruction: per After Visit Summary and Postpartum booklet. Activity: Advance as tolerated. Pelvic rest for 6 weeks.  Diet: {OB diet:21111121} Anticipated  Birth Control: {Birth Control:23956} Postpartum Appointment:{Outpatient follow up:23559} Additional Postpartum F/U: {PP Procedure:23957} Future Appointments: Future Appointments  Date Time Provider Department Center  09/23/2023  3:15 PM Janit Alm Agent, MD [facility] None   Follow up Visit:  Follow-up Information     Janit Alm Agent, MD. Schedule an appointment as soon as possible for a visit in 1 week(s).   Specialties: Obstetrics and Gynecology, Radiology Why: Incision check Contact information: [address] [place] [address] [phone]         [facility] [place] [facility] at [facility]. Schedule an appointment as soon as possible for a visit in 6 week(s).   Specialty: Obstetrics and Gynecology Why: Postpartum visit Contact information: [address] [place]  [phone]                    09/15/2023 [name], CNM

## 2023-09-15 NOTE — Anesthesia Preprocedure Evaluation (Addendum)
 Anesthesia Evaluation  Patient identified by MRN, date of birth, ID band Patient awake    Reviewed: Allergy & Precautions, H&P , NPO status , Patient's Chart, lab work & pertinent test results  Airway Mallampati: III  TM Distance: >3 FB Neck ROM: full    Dental no notable dental hx.    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 No date: GERD (gastroesophageal reflux disease) No date: Hypoglycemia 03/25/2018: Status post cesarean delivery  Past Surgical History: 03/23/2018: CESAREAN SECTION; N/A     Comment:  Procedure: CESAREAN SECTION;  Surgeon: Arloa Lamar SQUIBB,              MD;  Location: [facility] ORS;  Service: Obstetrics;                Laterality: N/A;  TOB: 15:11 Sex Female Wt: 6 lb 5               oz   09/04/2016: LAPAROSCOPIC APPENDECTOMY; N/A     Comment:  Procedure: APPENDECTOMY LAPAROSCOPIC;  Surgeon: Shelva Dunnings, MD;  Location: [facility] ORS;  Service: General;                Laterality: N/A;  BMI    Body Mass Index: 31.09 kg/m      Reproductive/Obstetrics (+) Pregnancy                              Anesthesia Physical Anesthesia Plan  ASA: 2  Anesthesia Plan: Spinal   Post-op Pain Management: Minimal or no pain anticipated   Induction: Intravenous  PONV Risk Score and Plan: Ondansetron   Airway Management Planned: Natural Airway  Additional Equipment:   Intra-op Plan:   Post-operative Plan:   Informed Consent: I have  reviewed the patients History and Physical, chart, labs and discussed the procedure including the risks, benefits and alternatives for the proposed anesthesia with the patient or authorized representative who has indicated his/her understanding and acceptance.     Dental Advisory Given  Plan Discussed with: Anesthesiologist and CRNA  Anesthesia Plan Comments:          Anesthesia Quick Evaluation

## 2023-09-15 NOTE — Op Note (Signed)
     OP NOTE  Date: 09/15/2023   11:17 AM Name Stacey Donovan MR# [id number]  Preoperative Diagnosis: 1. Intrauterine pregnancy at 110w0d Principal Problem:   Term pregnancy, repeat  2.  Pt desires repeat CD 3.  H/O bicornuate uterus  Postoperative Diagnosis: 1. Intrauterine pregnancy at [redacted]w[redacted]d, delivered 2. Viable infant 3. Remainder same as pre-op 4. Bicornuate uterus   Procedure: 1. Repeat Low-Transverse Cesarean Section  Surgeon: Alm DOROTHA Sar, MD  Assistant:  Jayne HOWARD  Anesthesia: Spinal   EBL: 425 ml     Findings: 1) female infant, Apgar scores of 8   at 1 minute and 9   at 5 minutes and a birthweight of 122.75 ounces.    2) Normal uterus, tubes and ovaries.    Procedure:  The patient was prepped and draped in the supine position and placed under spinal anesthesia.  A transverse incision was made across the abdomen in a Pfannenstiel manner. If indicated the old scar was systematically removed with sharp dissection.  We carried the dissection down to the level of the fascia.  The fascia was incised in a curvilinear manner.  The fascia was then elevated from the rectus muscles with blunt and sharp dissection.  The rectus muscles were separated laterally exposing the peritoneum.  The peritoneum was carefully entered with care being taken to avoid bowel and bladder.  A self-retaining retractor was placed.  The visceral peritoneum was incised in a curvilinear fashion across the lower uterine segment creating a bladder flap. A transverse incision was made across the lower uterine segment and extended laterally and superiorly with blunt dissection.  Artificial rupture membranes was performed and Clear fluid was noted.  The infant was delivered from the cephalic position.  A nuchal cord was present. After an appropriate time interval, the cord was doubly clamped and cut. Cord blood was obtained if required.  The infant was handed to the pediatric personnel  who then placed  the infant under heat lamps where it was cleaned dried and suctioned as needed. The placenta was delivered. The hysterotomy incision was then identified on ring forceps.  The uterine cavity was cleaned with a moist lap sponge.  The hysterotomy incision was closed with a running interlocking suture of Vicryl.  Hemostasis was excellent.  Pitocin  was run in the IV and the uterus was found to be firm. Bicornuate uterus was again obvious and identified as such. The posterior cul-de-sac and gutters were cleaned and inspected.  Hemostasis was noted.  The fascia was then closed with a running suture of #1 Vicryl.  Hemostasis of the subcutaneous tissues was obtained using the Bovie.  The subcutaneous tissues were closed with a running suture of 000 Vicryl.  A subcuticular suture was placed.  Steri-strips were applied in the usual manner.  A Lidoderm  patch was applied.  A pressure dressing was placed.  The patient went to the recovery room in stable condition. Jayne CNM provided exposure, dissection, suctioning, retraction, and general support and assistance during the procedure.   Alm DOROTHA Sar, M.D. 09/15/2023 11:17 AM

## 2023-09-15 NOTE — Lactation Note (Signed)
 This note was copied from a baby's chart. Lactation Consultation Note  Patient Name: Stacey Donovan Unijb'd Date: 09/15/2023 Age:27 hours Reason for consult: Initial assessment;Term   Maternal Data Does the patient have breastfeeding experience prior to this delivery?: Yes How long did the patient breastfeed?: 1 wk  Initial assessment w/ a 4hr old baby Stacey and P2 patient.  This was an repeat c-section delivery.   Feeding goal is breastfeeding, but infant has already received a bottle of formula.    Patient stated that she breastfed her first for 1 week because infant had a hard time latching.    Feeding Mother's Current Feeding Choice: Breast Milk and Formula Nipple Type: Slow - flow  Lactation Tools Discussed/Used Tools: Pump Breast pump type: Double-Electric Breast Pump Pump Education: Setup, frequency, and cleaning;Milk Storage Reason for Pumping: Patient requested pump from RN. Pumping frequency: 8x w/in a 24hr period Pumped volume: 3.5 mL  DEBP set up in room, education was provided on how to use pump.  Mom using a size 18mm flange.  MOB able to pump out 3.83ml from right breast.  Interventions Interventions: Breast feeding basics reviewed;DEBP;Education;CDC milk storage guidelines  LC provided education on the following;  milk production expectations, hunger cues, day 1/2 wet/dirty diapers, hand expression, cluster feeding, benefits of STS and arousing infant for a feeding.  Lactation informed patient of feeding infant at least 8 or more times w/in a 24hr period but not exceeding 3hrs. Patient verbalized understanding.   Discharge Pump: Personal  Consult Status Consult Status: Follow-up Follow-up type: In-patient    Matti Minney S Kerry Odonohue 09/15/2023, 4:30 PM

## 2023-09-15 NOTE — Transfer of Care (Signed)
 Immediate Anesthesia Transfer of Care Note  Patient: Stacey Donovan  Procedure(s) Performed: CESAREAN DELIVERY  Patient Location: PACU and Mother/Baby  Anesthesia Type:Spinal  Level of Consciousness: awake  Airway & Oxygen Therapy: Patient Spontanous Breathing  Post-op Assessment: Report given to RN and Post -op Vital signs reviewed and stable  Post vital signs: Reviewed and stable  Last Vitals:  Vitals Value Taken Time  BP    Temp    Pulse 56 09/15/23 11:24  Resp 22 09/15/23 11:24  SpO2 92 % 09/15/23 11:24  Vitals shown include unfiled device data.  Last Pain:  Vitals:   09/15/23 0815  TempSrc: Oral  PainSc: 0-No pain         Complications: No notable events documented.

## 2023-09-15 NOTE — H&P (Signed)
 History and Physical   HPI  Stacey Donovan is a 28 y.o. G2P1001 at [redacted]w[redacted]d Estimated Date of Delivery: 09/22/23 who is being admitted for C-section.   OB History  OB History  Gravida Para Term Preterm AB Living  2 1 1  0 0 1  SAB IAB Ectopic Multiple Live Births  0 0 0 0 1    # Outcome Date GA Lbr Len/2nd Weight Sex Type Anes PTL Lv  2 Current           1 Term 03/23/18 [redacted]w[redacted]d 32:46 / 02:25 2850 g F CS-LTranv EPI  LIV     Name: MAELA, TAKEDA     Apgar1: 8  Apgar5: 9    PROBLEM LIST  Pregnancy complications or risks: Patient Active Problem List   Diagnosis Date Noted   Term pregnancy, repeat 09/15/2023   History of cesarean delivery 04/09/2023   Supervision of other normal pregnancy, antepartum 02/16/2023   Bicornuate uterus affecting pregnancy, antepartum 03/23/2018    Prenatal labs and studies: ABO, Rh: --/--/A POS (08/04 0931) Antibody: NEG (08/04 0931) Rubella: 1.46 (01/30 1538) RPR: NON REACTIVE (08/04 0931)  HBsAg: Negative (01/30 1538)  HIV: Non Reactive (05/21 1530)  HAD:Wzhjupcz/-- (07/16 1403)   Past Medical History:  Diagnosis Date   Anxiety    Appendicitis 09/04/2016   Bicornuate uterus    GERD (gastroesophageal reflux disease)    Hypoglycemia    Status post cesarean delivery 03/25/2018     Past Surgical History:  Procedure Laterality Date   CESAREAN SECTION N/A 03/23/2018   Procedure: CESAREAN SECTION;  Surgeon: Arloa Lamar SQUIBB, MD;  Location: [facility] ORS;  Service: Obstetrics;  Laterality: N/A;  TOB: 15:11 Sex Female Wt: 6 lb 5 oz     LAPAROSCOPIC APPENDECTOMY N/A 09/04/2016   Procedure: APPENDECTOMY LAPAROSCOPIC;  Surgeon: Shelva Dunnings, MD;  Location: [facility] ORS;  Service: General;  Laterality: N/A;     Medications    Current Discharge Medication List     CONTINUE these medications which have NOT CHANGED   Details  Prenatal Vit-Fe Fumarate-FA (M-NATAL PLUS) 27-1 MG TABS Take 1 tablet by mouth daily.          Allergies  Patient has no known allergies.  Review of Systems  Pertinent items noted in HPI and remainder of comprehensive ROS otherwise negative.  Physical Exam  BP 127/86 (BP Location: Left Arm)   Pulse 82   Temp 97.9 F (36.6 C) (Oral)   Resp 16   Ht 5' 2 (1.575 m)   Wt 77.1 kg   LMP 12/16/2022   BMI 31.09 kg/m   Lungs:  CTA B Cardio: RRR without M/R/G Abd: Soft, gravid, NT Presentation: cephalic EXT: No C/C/ 1+ Edema DTRs: 2+ B CERVIX:    See Prenatal records for more detailed PE.     FHR:  Variability: Good {> 6 bpm)  Toco: Uterine Contractions: None  Test Results  Results for orders placed or performed during the hospital encounter of 09/14/23 (from the past 24 hours)  CBC     Status: Abnormal   Collection Time: 09/14/23  9:31 AM  Result Value Ref Range   WBC 6.5 4.0 - 10.5 K/uL   RBC 4.04 3.87 - 5.11 MIL/uL   Hemoglobin 10.1 (L) 12.0 - 15.0 g/dL   HCT 68.6 (L) 63.9 - 53.9 %   MCV 77.5 (L) 80.0 - 100.0 fL   MCH 25.0 (L) 26.0 - 34.0 pg   MCHC 32.3 30.0 - 36.0  g/dL   RDW 86.3 88.4 - 84.4 %   Platelets 288 150 - 400 K/uL   nRBC 0.0 0.0 - 0.2 %  RPR     Status: None   Collection Time: 09/14/23  9:31 AM  Result Value Ref Range   RPR Ser Ql NON REACTIVE NON REACTIVE  Type and screen     Status: None   Collection Time: 09/14/23  9:31 AM  Result Value Ref Range   ABO/RH(D) A POS    Antibody Screen NEG    Sample Expiration 09/17/2023,2359    Extend sample reason      PREGNANT WITHIN 3 MONTHS, UNABLE TO EXTEND Performed at [facility], [address] [place], [place] [address]      Assessment   G2P1001 at [redacted]w[redacted]d Estimated Date of Delivery: 09/22/23  The fetus is reassuring.   Patient Active Problem List   Diagnosis Date Noted   Term pregnancy, repeat 09/15/2023   History of cesarean delivery 04/09/2023   Supervision of other normal pregnancy, antepartum 02/16/2023   Bicornuate uterus affecting pregnancy, antepartum  03/23/2018    Plan  1. Admit to L&D :   2. Repeat cesarean delivery.  Alm DOROTHA Sar, M.D. 09/15/2023 9:19 AM

## 2023-09-16 ENCOUNTER — Encounter: Payer: Self-pay | Admitting: Obstetrics and Gynecology

## 2023-09-16 LAB — CBC
HCT: 25.9 % — ABNORMAL LOW (ref 36.0–46.0)
Hemoglobin: 8.2 g/dL — ABNORMAL LOW (ref 12.0–15.0)
MCH: 25 pg — ABNORMAL LOW (ref 26.0–34.0)
MCHC: 31.7 g/dL (ref 30.0–36.0)
MCV: 79 fL — ABNORMAL LOW (ref 80.0–100.0)
Platelets: 256 K/uL (ref 150–400)
RBC: 3.28 MIL/uL — ABNORMAL LOW (ref 3.87–5.11)
RDW: 13.9 % (ref 11.5–15.5)
WBC: 9.9 K/uL (ref 4.0–10.5)
nRBC: 0 % (ref 0.0–0.2)

## 2023-09-16 MED ORDER — NALBUPHINE HCL 10 MG/ML IJ SOLN
5.0000 mg | INTRAMUSCULAR | Status: DC | PRN
  Administered 2023-09-16 (×2): 5 mg via INTRAVENOUS
  Filled 2023-09-16 (×4): qty 0.5

## 2023-09-16 MED ORDER — LORATADINE 10 MG PO TABS
10.0000 mg | ORAL_TABLET | Freq: Every day | ORAL | Status: DC
  Administered 2023-09-16 – 2023-09-17 (×2): 10 mg via ORAL
  Filled 2023-09-16 (×3): qty 1

## 2023-09-16 NOTE — Progress Notes (Signed)
 Progress Note - Cesarean Delivery  Stacey Donovan is a 28 y.o. G2P1001 now PP day 1 s/p C-Section, Low Transverse.   Subjective:  Patient reports no problems with eating, bowel movements, voiding, or their wound  She has been out of bed to avoid and she reports moving without problem.  She has begun to experience an increase in pain this morning as expected the more she moves around.  She is pumping and bottlefeeding.  She has had no issues with lightheadedness.  Objective:  Vital signs in last 24 hours: Temp:  [97.9 F (36.6 C)-98.5 F (36.9 C)] 98 F (36.7 C) (08/06 0322) Pulse Rate:  [54-99] 81 (08/06 0550) Resp:  [16-22] 20 (08/06 0322) BP: (85-127)/(48-87) 102/67 (08/06 0322) SpO2:  [92 %-99 %] 92 % (08/06 0550) Weight:  [77.1 kg] 77.1 kg (08/05 0823)  Physical Exam:  General: cooperative, appears stated age, and no distress Lochia: appropriate Uterine Fundus: firm Incision: Pressure dressing intact    Data Review Recent Labs    09/14/23 0931 09/16/23 0512  HGB 10.1* 8.2*  HCT 31.3* 25.9*    Assessment:  Principal Problem:   Term pregnancy, repeat   Status post Cesarean section. Doing well postoperatively.   No current symptoms of anemia despite hemoglobin of 8.2.  Plan:       Continue current care.  Continued out of bed today  May shower -dressing change after shower  Encouraged to breast-feed  Probable discharge tomorrow.   Alm DOROTHA Sar, M.D. 09/16/2023 7:52 AM

## 2023-09-16 NOTE — Anesthesia Post-op Follow-up Note (Signed)
  Anesthesia Pain Follow-up Note  Patient: Stacey Donovan  Day #: 1  Date of Follow-up: 09/16/2023 Time: 8:36 AM  Last Vitals:  Vitals:   09/16/23 0550 09/16/23 0808  BP:  108/68  Pulse: 81   Resp:  19  Temp:  36.8 C  SpO2: 92%     Level of Consciousness: alert  Pain: mild  Side Effects:None  Catheter Site Exam:clean, dry     Plan: D/C from anesthesia care at surgeon's request  [facility]

## 2023-09-16 NOTE — Anesthesia Postprocedure Evaluation (Signed)
 Anesthesia Post Note  Patient: Stacey Donovan  Procedure(s) Performed: CESAREAN DELIVERY  Patient location during evaluation: Mother Baby Anesthesia Type: Spinal Level of consciousness: awake, awake and alert and oriented Pain management: pain level controlled Vital Signs Assessment: post-procedure vital signs reviewed and stable Respiratory status: spontaneous breathing, nonlabored ventilation and respiratory function stable Cardiovascular status: blood pressure returned to baseline and stable Postop Assessment: no headache and no backache Anesthetic complications: no   No notable events documented.   Last Vitals:  Vitals:   09/16/23 0550 09/16/23 0808  BP:  108/68  Pulse: 81   Resp:  19  Temp:  36.8 C  SpO2: 92%     Last Pain:  Vitals:   09/16/23 0817  TempSrc:   PainSc: 7                  [employer]

## 2023-09-17 ENCOUNTER — Encounter: Payer: Self-pay | Admitting: Certified Nurse Midwife

## 2023-09-17 ENCOUNTER — Encounter: Payer: Self-pay | Admitting: *Deleted

## 2023-09-17 ENCOUNTER — Other Ambulatory Visit: Payer: Self-pay | Admitting: Certified Nurse Midwife

## 2023-09-17 MED ORDER — IBUPROFEN 600 MG PO TABS: 600.0000 mg | ORAL_TABLET | Freq: Four times a day (QID) | ORAL | 0 refills | Status: AC

## 2023-09-17 MED ORDER — OXYCODONE HCL 5 MG PO TABS: 5.0000 mg | ORAL_TABLET | Freq: Four times a day (QID) | ORAL | 0 refills | Status: DC | PRN

## 2023-09-17 MED ORDER — OXYCODONE HCL 5 MG PO TABS: 5.0000 mg | ORAL_TABLET | Freq: Four times a day (QID) | ORAL | 0 refills | Status: AC | PRN

## 2023-09-17 NOTE — Discharge Instructions (Signed)

## 2023-09-18 ENCOUNTER — Encounter: Payer: Self-pay | Admitting: Certified Nurse Midwife

## 2023-09-18 ENCOUNTER — Encounter: Payer: Self-pay | Admitting: Obstetrics and Gynecology

## 2023-09-23 ENCOUNTER — Ambulatory Visit: Admitting: Obstetrics and Gynecology

## 2023-09-23 ENCOUNTER — Encounter: Payer: Self-pay | Admitting: Obstetrics and Gynecology

## 2023-09-23 VITALS — BP 107/73 | HR 99 | Ht 62.0 in | Wt 151.1 lb

## 2023-09-23 DIAGNOSIS — Z9889 Other specified postprocedural states: Secondary | ICD-10-CM

## 2023-09-23 NOTE — Progress Notes (Signed)
 Patient presents today for 1 week postpartum follow-up. Patient had a cesarean delivery on 09/15/23. She is breast feeding and pumping. She states she unsure what she would like for birth control. EPDS score of 11.

## 2023-09-23 NOTE — Progress Notes (Signed)
 HPI:      Ms. Stacey Donovan is a 28 y.o. [phone] who LMP was No LMP recorded.  Subjective:   She presents today approximately 1 week from cesarean delivery-repeat with history of bicornuate uterus.  She reports she is doing very well.  Still has some pain but not bad.  She is able to function.  She has breast-feeding full-time.  Continues to have light vaginal bleeding.  Eating voiding and ambulating without difficulty.    Hx: The following portions of the patient's history were reviewed and updated as appropriate:             She  has a past medical history of Anxiety, Appendicitis (09/04/2016), Bicornuate uterus, GERD (gastroesophageal reflux disease), Hypoglycemia, and Status post cesarean delivery (03/25/2018). She does not have any pertinent problems on file. She  has a past surgical history that includes laparoscopic appendectomy (N/A, 09/04/2016); Cesarean section (N/A, 03/23/2018); and Cesarean section (N/A, 09/15/2023). Her family history includes Breast cancer in her maternal grandmother; Healthy in her brother, father, maternal grandfather, mother, and paternal grandmother. She  reports that she has quit smoking. Her smoking use included cigarettes. She has never used smokeless tobacco. She reports that she does not currently use drugs after having used the following drugs: Marijuana. She reports that she does not drink alcohol. She has a current medication list which includes the following prescription(s): ibuprofen , oxycodone , and m-natal plus. She has no known allergies.       Review of Systems:  Review of Systems  Constitutional: Denied constitutional symptoms, night sweats, recent illness, fatigue, fever, insomnia and weight loss.  Eyes: Denied eye symptoms, eye pain, photophobia, vision change and visual disturbance.  Ears/Nose/Throat/Neck: Denied ear, nose, throat or neck symptoms, hearing loss, nasal discharge, sinus congestion and sore throat.  Cardiovascular: Denied  cardiovascular symptoms, arrhythmia, chest pain/pressure, edema, exercise intolerance, orthopnea and palpitations.  Respiratory: Denied pulmonary symptoms, asthma, pleuritic pain, productive sputum, cough, dyspnea and wheezing.  Gastrointestinal: Denied, gastro-esophageal reflux, melena, nausea and vomiting.  Genitourinary: Denied genitourinary symptoms including symptomatic vaginal discharge, pelvic relaxation issues, and urinary complaints.  Musculoskeletal: Denied musculoskeletal symptoms, stiffness, swelling, muscle weakness and myalgia.  Dermatologic: Denied dermatology symptoms, rash and scar.  Neurologic: Denied neurology symptoms, dizziness, headache, neck pain and syncope.  Psychiatric: Denied psychiatric symptoms, anxiety and depression.  Endocrine: Denied endocrine symptoms including hot flashes and night sweats.   Meds:   Current Outpatient Medications on File Prior to Visit  Medication Sig Dispense Refill   ibuprofen  (ADVIL ) 600 MG tablet Take 1 tablet (600 mg total) by mouth every 6 (six) hours. 30 tablet 0   oxyCODONE  (OXY IR/ROXICODONE ) 5 MG immediate release tablet Take 1 tablet (5 mg total) by mouth every 6 (six) hours as needed for up to 8 days for severe pain (pain score 7-10). 30 tablet 0   Prenatal Vit-Fe Fumarate-FA (M-NATAL PLUS) 27-1 MG TABS Take 1 tablet by mouth daily.     No current facility-administered medications on file prior to visit.      Objective:     Vitals:   09/23/23 1521  BP: 107/73  Pulse: 99   Filed Weights   09/23/23 1521  Weight: 151 lb 1.6 oz (68.5 kg)               Abdomen: Soft.  Non-tender.  No masses.  No HSM.  Incision/s: Intact.  Healing well.  No erythema.  No drainage.   Dressing removed  Assessment:    [id number] Patient Active Problem List   Diagnosis Date Noted   Term pregnancy, repeat 09/15/2023   History of cesarean delivery 04/09/2023   Supervision of other normal pregnancy, antepartum 02/16/2023    Bicornuate uterus affecting pregnancy, antepartum 03/23/2018     1. Postoperative state   2. Postpartum care and examination     Excellent recovery postop   Plan:            1.  Wound care discussed.  Plan follow-up in 5 weeks. Orders No orders of the defined types were placed in this encounter.   No orders of the defined types were placed in this encounter.     F/U  Return in about 5 weeks (around 10/28/2023).  Alm DOROTHA Sar, M.D. 09/23/2023 3:49 PM

## 2023-10-27 NOTE — Progress Notes (Unsigned)
 Postpartum Visit  Chief Complaint: No chief complaint on file.   History of Present Illness: Patient is a 28 y.o. H7E7997 presents for postpartum visit.  Review the Delivery Report for details.  This patient has no babies on file. <<This visit is not associated with a pregnancy. Delivery information will not be displayed.>><<This visit is not associated with a pregnancy. Delivery information will not be displayed.>> Date of delivery: .ob Type of delivery: Vaginal delivery - Vacuum or forceps assisted  {yes/no:63} Episiotomy No.  Laceration: {yes/no:63}  Pregnancy or labor problems:  {yes/no:63} Any problems since the delivery:  {yes/no:63}  Newborn Details:  SINGLETON :  1. BabyGender {Gender Description:210950033}. Birth weight:   Maternal Details:  Breast or formula feeding: {Plan; breastfeeding:14556} Intercourse: {YES/NO:21197} Contraception after delivery: {YES/NO:21197} Any bowel or bladder issues: {YES/NO:21197} Post partum depression/anxiety noted:  {yes/no:63} Edinburgh Post-Partum Depression Score:*** Date of last PAP: ***  {Findings; lab pap smear results:16707::NIL and HR HPV+,NIL and HR HPV negative}   Review of Systems: ROS  {Common ambulatory SmartLinks:19316}  Past Medical History:  Past Medical History:  Diagnosis Date   Anxiety    Appendicitis 09/04/2016   Bicornuate uterus    GERD (gastroesophageal reflux disease)    Hypoglycemia    Status post cesarean delivery 03/25/2018    Past Surgical History:  Past Surgical History:  Procedure Laterality Date   CESAREAN SECTION N/A 03/23/2018   Procedure: CESAREAN SECTION;  Surgeon: Arloa Lamar SQUIBB, MD;  Location: [facility] ORS;  Service: Obstetrics;  Laterality: N/A;  TOB: 15:11 Sex Female Wt: 6 lb 5 oz     CESAREAN SECTION N/A 09/15/2023   Procedure: CESAREAN DELIVERY;  Surgeon: Janit Alm Agent, MD;  Location: [facility];  Service: Obstetrics;  Laterality: N/A;  Repeat   LAPAROSCOPIC APPENDECTOMY  N/A 09/04/2016   Procedure: APPENDECTOMY LAPAROSCOPIC;  Surgeon: Shelva Dunnings, MD;  Location: [facility];  Service: General;  Laterality: N/A;    Family History:  Family History  Problem Relation Age of Onset   Healthy Mother    Healthy Father    Healthy Brother    Breast cancer Maternal Grandmother    Healthy Maternal Grandfather    Healthy Paternal Grandmother     Social History:  Social History   Socioeconomic History   Marital status: Single    Spouse name: Penne   Number of children: 1   Years of education: 12   Highest education level: 12th grade  Occupational History   Not on file  Tobacco Use   Smoking status: Former    Current packs/day: 1.00    Types: Cigarettes   Smokeless tobacco: Never  Vaping Use   Vaping status: Never Used  Substance and Sexual Activity   Alcohol use: No   Drug use: Not Currently    Types: Marijuana   Sexual activity: Yes    Birth control/protection: None  Other Topics Concern   Not on file  Social History Narrative   Not on file   Social Drivers of Health   Financial Resource Strain: Low Risk  (02/16/2023)   Overall Financial Resource Strain (CARDIA)    Difficulty of Paying Living Expenses: Not very hard  Food Insecurity: No Food Insecurity (09/15/2023)   Hunger Vital Sign    Worried About Running Out of Food in the Last Year: Never true    Ran Out of Food in the Last Year: Never true  Transportation Needs: No Transportation Needs (09/15/2023)   PRAPARE - Transportation  Lack of Transportation (Medical): No    Lack of Transportation (Non-Medical): No  Physical Activity: Sufficiently Active (02/16/2023)   Exercise Vital Sign    Days of Exercise per Week: 2 days    Minutes of Exercise per Session: 120 min  Stress: No Stress Concern Present (02/16/2023)   [employer] of Occupational Health - Occupational Stress Questionnaire    Feeling of Stress : Only a little  Social Connections: Moderately Integrated (02/16/2023)    Social Connection and Isolation Panel    Frequency of Communication with Friends and Family: More than three times a week    Frequency of Social Gatherings with Friends and Family: Once a week    Attends Religious Services: More than 4 times per year    Active Member of [employer] or Organizations: No    Attends Banker Meetings: Never    Marital Status: Living with partner  Intimate Partner Violence: Not At Risk (09/16/2023)   Humiliation, Afraid, Rape, and Kick questionnaire    Fear of Current or Ex-Partner: No    Emotionally Abused: No    Physically Abused: No    Sexually Abused: No    Allergies:  No Known Allergies  Medications: Prior to Admission medications   Medication Sig Start Date End Date Taking? Authorizing Provider  ibuprofen  (ADVIL ) 600 MG tablet Take 1 tablet (600 mg total) by mouth every 6 (six) hours. 09/17/23   Slaughterbeck, Damien, CNM  Prenatal Vit-Fe Fumarate-FA (M-NATAL PLUS) 27-1 MG TABS Take 1 tablet by mouth daily. 03/15/23   [provider]    Physical Exam currently breastfeeding.    General: NAD HEENT: normocephalic, anicteric Pulmonary: No increased work of breathing Abdomen: NABS, soft, non-tender, non-distended.  Umbilicus without lesions.  No hepatomegaly, splenomegaly or masses palpable. No evidence of hernia. Genitourinary:  External: Normal external female genitalia.  Normal urethral meatus, normal  Bartholin's and Skene's glands.    Vagina: Normal vaginal mucosa, no evidence of prolapse.    Cervix: Grossly normal in appearance, no bleeding  Uterus: Non-enlarged, mobile, normal contour.  No CMT  Adnexa: ovaries non-enlarged, no adnexal masses  Rectal: deferred Extremities: no edema, erythema, or tenderness Neurologic: Grossly intact Psychiatric: mood appropriate, affect full    Assessment: 28 y.o. [id number] presenting for 6 week postpartum visit  Plan: Problem List Items Addressed This Visit   None    1)  Contraception - Education given regarding options for contraception, as well as compatibility with breast feeding if applicable.  Patient plans on {method:5051} for contraception.  2)  Pap - ASCCP guidelines and rational discussed.  ASCCP guidelines and rational discussed.  Patient opts for {Blank single:19197::***,every 5 years,every 3 years,yearly,discontinue age >65,discontinue secondary to prior hysterectomy} screening interval  3) Patient underwent screening for postpartum depression with {CHL AMB PED [facility] INDICATIONS:21338}  4) No follow-ups on file.   Jinnie Cookey, CNM  [facility], [facility] 10/27/2023, 4:14 PM

## 2023-10-28 ENCOUNTER — Encounter: Payer: Self-pay | Admitting: Licensed Practical Nurse

## 2023-10-28 ENCOUNTER — Ambulatory Visit: Admitting: Licensed Practical Nurse

## 2023-10-28 ENCOUNTER — Ambulatory Visit: Admitting: Obstetrics and Gynecology

## 2023-10-28 DIAGNOSIS — Z30013 Encounter for initial prescription of injectable contraceptive: Secondary | ICD-10-CM

## 2023-10-28 DIAGNOSIS — Z1332 Encounter for screening for maternal depression: Secondary | ICD-10-CM

## 2023-10-28 DIAGNOSIS — F53 Postpartum depression: Secondary | ICD-10-CM

## 2023-10-28 DIAGNOSIS — N853 Subinvolution of uterus: Secondary | ICD-10-CM | POA: Diagnosis not present

## 2023-10-28 MED ORDER — SERTRALINE HCL 50 MG PO TABS: 50.0000 mg | ORAL_TABLET | Freq: Every day | ORAL | 6 refills | Status: AC

## 2023-10-28 MED ORDER — MEDROXYPROGESTERONE ACETATE 150 MG/ML IM SUSY
150.0000 mg | PREFILLED_SYRINGE | Freq: Once | INTRAMUSCULAR | Status: AC
  Administered 2023-10-28: 150 mg via INTRAMUSCULAR

## 2023-10-28 NOTE — Patient Instructions (Signed)
 Urgent support: 988 Call, text or chat HandlingCost.fr  911 if immediate concern for safety  Mobile crisis teams: - [place], [place], [place]: [facility]: https://freedomhouserecovery.org/service/mobile-crisis-team/ [facility]: https://[facility].org/services/crisis-services-[place]/mobile-crisis-services/  - [place]: https://integratedfamilyservices.net/services/mobile-crisis-management/  DHHS resources: https://www.stanton-reyes.com/  Walk-in assessments: [facility], [place]: https://rhahealthservices.org/locations/[place]-behavioral-health-office/  [facility], walk-ins accepted until 3pm, virtual care also available - [place]: GotForum.co.uk - [place]: NoteBack.[email or web address].za  Outpatient/clinic: [facility], [place]: [email or web address]://[email or web address].http://Bucks-odom.com/ - walk-in assessments available https://www.http://www.shaw-martin.org/  [facility], [place]: HairSlick.no  [facility], [place]: PermaCloud.es  Virtual: - [facility], virtual intensive outpatient group & individual therapy, medication management: https://avila-olson.com/  Inpatient: [facility] Per[facility]atal Psychiatry Inpatient Unit: LifeHead.nl  [place], [place]: [email or web address]/ - walk in 24/7   Matters: TanningExpo.co.nz [phone], M-F 8-5  Peer support: Postpartum [employer]: https://www.carson.info/  [employer] KnowRentals.no

## 2023-12-10 ENCOUNTER — Ambulatory Visit: Admitting: Licensed Practical Nurse
